# Patient Record
Sex: Female | Born: 1970 | Race: White | Hispanic: No | Marital: Single | Smoking: Current every day smoker
Health system: Southern US, Community
[De-identification: ages and names within clinical notes are randomized; demographics above are authoritative.]

## PROBLEM LIST (undated history)

## (undated) DIAGNOSIS — I10 Essential (primary) hypertension: Secondary | ICD-10-CM

## (undated) DIAGNOSIS — G459 Transient cerebral ischemic attack, unspecified: Secondary | ICD-10-CM

## (undated) DIAGNOSIS — E669 Obesity, unspecified: Secondary | ICD-10-CM

## (undated) DIAGNOSIS — E785 Hyperlipidemia, unspecified: Secondary | ICD-10-CM

## (undated) DIAGNOSIS — K859 Acute pancreatitis without necrosis or infection, unspecified: Secondary | ICD-10-CM

## (undated) DIAGNOSIS — F17200 Nicotine dependence, unspecified, uncomplicated: Secondary | ICD-10-CM

## (undated) HISTORY — DX: Acute pancreatitis without necrosis or infection, unspecified: K85.90

## (undated) HISTORY — DX: Transient cerebral ischemic attack, unspecified: G45.9

## (undated) HISTORY — PX: CHOLECYSTECTOMY: SHX55

---

## 2006-06-18 ENCOUNTER — Emergency Department: Payer: Self-pay | Admitting: Emergency Medicine

## 2008-03-12 ENCOUNTER — Inpatient Hospital Stay: Payer: Self-pay | Admitting: Obstetrics & Gynecology

## 2008-10-16 ENCOUNTER — Emergency Department: Payer: Self-pay | Admitting: Emergency Medicine

## 2009-08-10 ENCOUNTER — Ambulatory Visit: Payer: Self-pay | Admitting: Family Medicine

## 2009-08-10 ENCOUNTER — Encounter (INDEPENDENT_AMBULATORY_CARE_PROVIDER_SITE_OTHER): Payer: Self-pay | Admitting: Family Medicine

## 2009-08-10 ENCOUNTER — Encounter: Payer: Self-pay | Admitting: Family Medicine

## 2009-08-10 DIAGNOSIS — J018 Other acute sinusitis: Secondary | ICD-10-CM

## 2010-03-29 ENCOUNTER — Emergency Department: Payer: Self-pay | Admitting: Unknown Physician Specialty

## 2010-05-28 NOTE — Assessment & Plan Note (Signed)
Summary: SINUS-EAR/JBB

## 2010-05-28 NOTE — Assessment & Plan Note (Signed)
Summary: Sinusitis   Vital Signs:  Patient Profile:   40 Years Old Female CC:      POSSIBLE SINUS INFECTION / RWT Height:     60 inches Weight:      187 pounds BMI:     36.65 Temp:     98.6 degrees F oral Pulse rate:   86 / minute Pulse rhythm:   regular Resp:     20 per minute BP sitting:   120 / 76  (left arm)  Vitals Entered By: Levonne Spiller EMT-P (August 10, 2009 11:59 AM)             Is Patient Diabetic? No Comments PT. IS A SMOKER. 1 PACK PER DAY.      Current Allergies: ! CODEINE SULFATE (CODEINE SULFATE)History of Present Illness Chief Complaint: POSSIBLE SINUS INFECTION / RWT History of Present Illness: Has been fighting a sinus infection for about 3 weeks now. No f/c. No history of frequent infections. Feels like her "head is going to explode". Thought she was getting better but this week has been much worse and last 3 days she feels like "she has to do something". Her 31mo child has also been sick. He is in daycare. She has taken him to 2 hospitals in the last week.   REVIEW OF SYSTEMS Constitutional Symptoms       Complains of fatigue.     Denies fever, chills, and night sweats.  Ear/Nose/Throat/Mouth       Complains of ear pain, frequent runny nose, and sinus problems.      Denies dizziness, sore throat, and hoarseness.  Respiratory       Denies dry cough, productive cough, wheezing, and shortness of breath.      Physical Exam General appearance: WDWN female - tired appearing Ears: reduced light reflex bilaterally Nasal: swollen red turbinates with congestion, clear discharge Oral/Pharynx: tongue normal, posterior pharynx without erythema or exudate Neck: supple,anterior lymphadenopathy present, tender Chest/Lungs: no rales, wheezes, or rhonchi bilateral, breath sounds equal without effort Heart: regular rate and  rhythm, no murmur Tender red maxillary sinuses, right with swelling. Tender frontal sinuses. Assessment New Problems: OTHER ACUTE SINUSITIS  (ICD-461.8)   Patient Education: Patient and/or caregiver instructed in the following: rest fluids and Tylenol, quit smoking.  Plan New Medications/Changes: MAXIFED 60-400 MG TABS (PSEUDOEPHEDRINE-GUAIFENESIN) 1 by mouth Q8 hours as needed congestion  #20 x 0, 08/10/2009, Tacey Ruiz MD AMOXICILLIN 500 MG CAPS (AMOXICILLIN) 1 by mouth three times a day x 10 days  #30 x 0, 08/10/2009, Tacey Ruiz MD  New Orders: New Patient Level II [phone]  The patient and/or caregiver has been counseled thoroughly with regard to medications prescribed including dosage, schedule, interactions, rationale for use, and possible side effects and they verbalize understanding.  Diagnoses and expected course of recovery discussed and will return if not improved as expected or if the condition worsens. Patient and/or caregiver verbalized understanding.  Prescriptions: MAXIFED 60-400 MG TABS (PSEUDOEPHEDRINE-GUAIFENESIN) 1 by mouth Q8 hours as needed congestion  #20 x 0   Entered and Authorized by:   Tacey Ruiz MD   Signed by:   Tacey Ruiz MD on 08/10/2009   Method used:   Electronically to        [facility] [address] (retail)       [address], [address]       [place], [place]  [address]       Ph:  [phone]       Fax: [phone]   RxID:   [id number] AMOXICILLIN 500 MG CAPS (AMOXICILLIN) 1 by mouth three times a day x 10 days  #30 x 0   Entered and Authorized by:   Tacey Ruiz MD   Signed by:   Tacey Ruiz MD on 08/10/2009   Method used:   Electronically to        [facility] [address] (retail)       [address], [address]       [place], [place]  [address]       Ph: [phone]       Fax: [phone]   RxID:   [id number]   Patient Instructions: 1)  Please schedule a follow-up appointment as needed. 2)  Tobacco is very bad for your health and your loved ones! You Should stop smoking!. 3)  Take over the counter cough and cold  medications only as directed on the package insert. DO NOT take more than recommended . 4)  Take your antibiotic as prescribed until ALL of it is gone, but stop if you develop a rash or swelling and contact our office as soon as possible.  __________________________________________________________________________________ I have reviewed the above medical office visit documention, including diagnoses, history, medications, clinical lists, orders and plan of care.   Rodney Langton, MD, FAAFP  August 10, 2009 Added new allergy or adverse reaction of CODEINE SULFATE (CODEINE SULFATE) - Signed     Rodney Langton, M.D., F.A.A.F.P.  August 10, 2009

## 2010-10-25 ENCOUNTER — Emergency Department: Payer: Self-pay | Admitting: Emergency Medicine

## 2011-04-17 ENCOUNTER — Emergency Department: Payer: Self-pay | Admitting: Emergency Medicine

## 2011-04-27 ENCOUNTER — Emergency Department: Payer: Self-pay | Admitting: Emergency Medicine

## 2011-07-07 ENCOUNTER — Emergency Department: Payer: Self-pay | Admitting: Emergency Medicine

## 2011-07-11 ENCOUNTER — Inpatient Hospital Stay: Payer: Self-pay | Admitting: Internal Medicine

## 2011-07-11 LAB — BASIC METABOLIC PANEL
Anion Gap: 13 (ref 7–16)
Calcium, Total: 8.6 mg/dL (ref 8.5–10.1)
Co2: 23 mmol/L (ref 21–32)
EGFR (African American): >60
Osmolality: 279 (ref 275–301)
Sodium: 140 mmol/L (ref 136–145)

## 2011-07-11 LAB — TSH: Thyroid Stimulating Horm: 1.11 u[IU]/mL

## 2011-07-11 LAB — CBC
HCT: 40 % (ref 35.0–47.0)
MCHC: 33.1 g/dL (ref 32.0–36.0)

## 2011-07-12 LAB — CBC WITH DIFFERENTIAL/PLATELET
Eosinophil #: 0 10*3/uL (ref 0.0–0.7)
HCT: 33.9 % — ABNORMAL LOW (ref 35.0–47.0)
Lymphocyte %: 8.7 %
MCHC: 33.2 g/dL (ref 32.0–36.0)
MCV: 85 fL (ref 80–100)
Monocyte #: 0.6 10*3/uL (ref 0.0–0.7)
Neutrophil %: 86.7 %
RBC: 3.99 10*6/uL (ref 3.80–5.20)
RDW: 15 % — ABNORMAL HIGH (ref 11.5–14.5)
WBC: 14.7 10*3/uL — ABNORMAL HIGH (ref 3.6–11.0)

## 2011-07-12 LAB — BASIC METABOLIC PANEL
Chloride: 110 mmol/L — ABNORMAL HIGH (ref 98–107)
Co2: 22 mmol/L (ref 21–32)
EGFR (Non-African Amer.): >60
Osmolality: 285 (ref 275–301)

## 2011-07-13 LAB — CBC WITH DIFFERENTIAL/PLATELET
Basophil #: 0 10*3/uL (ref 0.0–0.1)
Basophil %: 0 %
HCT: 32.2 % — ABNORMAL LOW (ref 35.0–47.0)
Lymphocyte #: 1.6 10*3/uL (ref 1.0–3.6)
MCH: 28.1 pg (ref 26.0–34.0)
MCV: 86 fL (ref 80–100)
Monocyte #: 0.7 10*3/uL (ref 0.0–0.7)
Monocyte %: 4.3 %
Neutrophil #: 14.6 10*3/uL — ABNORMAL HIGH (ref 1.4–6.5)
Platelet: 250 10*3/uL (ref 150–440)
RDW: 15.4 % — ABNORMAL HIGH (ref 11.5–14.5)
WBC: 16.9 10*3/uL — ABNORMAL HIGH (ref 3.6–11.0)

## 2011-07-13 LAB — BASIC METABOLIC PANEL
Calcium, Total: 8.5 mg/dL (ref 8.5–10.1)
Chloride: 110 mmol/L — ABNORMAL HIGH (ref 98–107)
Co2: 24 mmol/L (ref 21–32)
EGFR (African American): >60
EGFR (Non-African Amer.): >60
Glucose: 132 mg/dL — ABNORMAL HIGH (ref 65–99)
Osmolality: 283 (ref 275–301)
Potassium: 3.8 mmol/L (ref 3.5–5.1)
Sodium: 142 mmol/L (ref 136–145)

## 2011-07-15 LAB — CBC WITH DIFFERENTIAL/PLATELET
Basophil %: 0.1 %
Eosinophil #: 0 10*3/uL (ref 0.0–0.7)
Eosinophil %: 0 %
HCT: 31.4 % — ABNORMAL LOW (ref 35.0–47.0)
HGB: 10.4 g/dL — ABNORMAL LOW (ref 12.0–16.0)
Lymphocyte %: 15.8 %
Monocyte %: 5.7 %
Neutrophil #: 9.5 10*3/uL — ABNORMAL HIGH (ref 1.4–6.5)
Neutrophil %: 78.4 %
RDW: 15 % — ABNORMAL HIGH (ref 11.5–14.5)
WBC: 12.1 10*3/uL — ABNORMAL HIGH (ref 3.6–11.0)

## 2011-07-15 LAB — BASIC METABOLIC PANEL
BUN: 15 mg/dL (ref 7–18)
Calcium, Total: 8.5 mg/dL (ref 8.5–10.1)
Chloride: 103 mmol/L (ref 98–107)
Creatinine: 0.69 mg/dL (ref 0.60–1.30)
EGFR (African American): >60

## 2011-07-16 LAB — CULTURE, BLOOD (SINGLE)

## 2011-09-23 ENCOUNTER — Ambulatory Visit: Payer: Self-pay | Admitting: Specialist

## 2011-12-02 ENCOUNTER — Inpatient Hospital Stay: Payer: Self-pay | Admitting: Internal Medicine

## 2011-12-02 LAB — URINALYSIS, COMPLETE
Glucose,UR: NEGATIVE mg/dL (ref 0–75)
Ketone: NEGATIVE
Leukocyte Esterase: NEGATIVE
Ph: 5 (ref 4.5–8.0)
RBC,UR: 1 /HPF (ref 0–5)
Squamous Epithelial: <1
WBC UR: <1 /HPF (ref 0–5)

## 2011-12-02 LAB — CBC WITH DIFFERENTIAL/PLATELET
Basophil %: 0.2 %
Eosinophil #: 0 10*3/uL (ref 0.0–0.7)
Eosinophil %: 0.2 %
HGB: 13 g/dL (ref 12.0–16.0)
Lymphocyte %: 33.5 %
Neutrophil %: 65.7 %
RBC: 4.6 10*6/uL (ref 3.80–5.20)

## 2011-12-02 LAB — COMPREHENSIVE METABOLIC PANEL
Albumin: 3.5 g/dL (ref 3.4–5.0)
Alkaline Phosphatase: 86 U/L (ref 50–136)
BUN: 6 mg/dL — ABNORMAL LOW (ref 7–18)
Calcium, Total: 8.6 mg/dL (ref 8.5–10.1)
Chloride: 106 mmol/L (ref 98–107)
Co2: 22 mmol/L (ref 21–32)
EGFR (Non-African Amer.): >60
Glucose: 99 mg/dL (ref 65–99)
Osmolality: 277 (ref 275–301)
Potassium: 3.5 mmol/L (ref 3.5–5.1)
SGOT(AST): 17 U/L (ref 15–37)
SGPT (ALT): 13 U/L (ref 12–78)
Total Protein: 7 g/dL (ref 6.4–8.2)

## 2011-12-02 LAB — CK TOTAL AND CKMB (NOT AT ARMC): CK-MB: <0.5 ng/mL — ABNORMAL LOW (ref 0.5–3.6)

## 2011-12-02 LAB — DRUG SCREEN, URINE
Barbiturates, Ur Screen: NEGATIVE (ref ?–200)
Cannabinoid 50 Ng, Ur ~~LOC~~: NEGATIVE (ref ?–50)
Cocaine Metabolite,Ur ~~LOC~~: NEGATIVE (ref ?–300)
MDMA (Ecstasy)Ur Screen: NEGATIVE (ref ?–500)
Methadone, Ur Screen: NEGATIVE (ref ?–300)
Opiate, Ur Screen: NEGATIVE (ref ?–300)
Phencyclidine (PCP) Ur S: NEGATIVE (ref ?–25)
Tricyclic, Ur Screen: NEGATIVE (ref ?–1000)

## 2011-12-03 LAB — BASIC METABOLIC PANEL
BUN: 11 mg/dL (ref 7–18)
Calcium, Total: 8.5 mg/dL (ref 8.5–10.1)
Chloride: 111 mmol/L — ABNORMAL HIGH (ref 98–107)
EGFR (African American): >60
EGFR (Non-African Amer.): >60
Glucose: 149 mg/dL — ABNORMAL HIGH (ref 65–99)
Osmolality: 283 (ref 275–301)
Sodium: 141 mmol/L (ref 136–145)

## 2011-12-03 LAB — CBC WITH DIFFERENTIAL/PLATELET
Basophil #: 0 10*3/uL (ref 0.0–0.1)
Basophil %: 0 %
Eosinophil #: 0 10*3/uL (ref 0.0–0.7)
HGB: 12 g/dL (ref 12.0–16.0)
Lymphocyte %: 3.4 %
MCH: 28.2 pg (ref 26.0–34.0)
MCHC: 34 g/dL (ref 32.0–36.0)
MCV: 83 fL (ref 80–100)
Monocyte %: 2.4 %
Neutrophil %: 94.2 %
RBC: 4.25 10*6/uL (ref 3.80–5.20)
RDW: 15 % — ABNORMAL HIGH (ref 11.5–14.5)
WBC: 12.6 10*3/uL — ABNORMAL HIGH (ref 3.6–11.0)

## 2012-10-31 ENCOUNTER — Emergency Department: Payer: Self-pay | Admitting: Emergency Medicine

## 2012-10-31 LAB — LIPASE, BLOOD: Lipase: 390 U/L (ref 73–393)

## 2012-10-31 LAB — COMPREHENSIVE METABOLIC PANEL
Albumin: 3.8 g/dL (ref 3.4–5.0)
Bilirubin,Total: 0.4 mg/dL (ref 0.2–1.0)
Chloride: 106 mmol/L (ref 98–107)
EGFR (African American): >60
Osmolality: 277 (ref 275–301)
Potassium: 3.9 mmol/L (ref 3.5–5.1)
SGPT (ALT): 16 U/L (ref 12–78)
Sodium: 138 mmol/L (ref 136–145)
Total Protein: 7.2 g/dL (ref 6.4–8.2)

## 2012-10-31 LAB — URINALYSIS, COMPLETE
Bacteria: NONE SEEN
Bilirubin,UR: NEGATIVE
Glucose,UR: NEGATIVE mg/dL (ref 0–75)
Ketone: NEGATIVE
Leukocyte Esterase: NEGATIVE
Nitrite: NEGATIVE
Ph: 6 (ref 4.5–8.0)
Protein: NEGATIVE
RBC,UR: 1 /HPF (ref 0–5)
Specific Gravity: 1.011 (ref 1.003–1.030)
Squamous Epithelial: 1
WBC UR: <1 /HPF (ref 0–5)

## 2012-10-31 LAB — CBC
HCT: 36.4 % (ref 35.0–47.0)
HGB: 12.4 g/dL (ref 12.0–16.0)
MCH: 28.8 pg (ref 26.0–34.0)
MCHC: 34.2 g/dL (ref 32.0–36.0)
MCV: 84 fL (ref 80–100)
Platelet: 253 10*3/uL (ref 150–440)
RBC: 4.33 10*6/uL (ref 3.80–5.20)
RDW: 14.6 % — ABNORMAL HIGH (ref 11.5–14.5)
WBC: 10 10*3/uL (ref 3.6–11.0)

## 2012-10-31 LAB — TROPONIN I: Troponin-I: <0.02 ng/mL

## 2012-11-01 ENCOUNTER — Inpatient Hospital Stay: Payer: Self-pay | Admitting: Student

## 2012-11-01 LAB — COMPREHENSIVE METABOLIC PANEL
Albumin: 3.7 g/dL (ref 3.4–5.0)
Bilirubin,Total: 0.3 mg/dL (ref 0.2–1.0)
Calcium, Total: 9 mg/dL (ref 8.5–10.1)
Chloride: 108 mmol/L — ABNORMAL HIGH (ref 98–107)
EGFR (Non-African Amer.): >60
Osmolality: 275 (ref 275–301)
SGOT(AST): 13 U/L — ABNORMAL LOW (ref 15–37)
Sodium: 139 mmol/L (ref 136–145)
Total Protein: 7 g/dL (ref 6.4–8.2)

## 2012-11-01 LAB — CBC WITH DIFFERENTIAL/PLATELET
Basophil #: 0.1 10*3/uL (ref 0.0–0.1)
Basophil %: 0.7 %
HCT: 36.4 % (ref 35.0–47.0)
HGB: 12.2 g/dL (ref 12.0–16.0)
Lymphocyte %: 25.4 %
MCH: 28.5 pg (ref 26.0–34.0)
MCHC: 33.5 g/dL (ref 32.0–36.0)
MCV: 85 fL (ref 80–100)
Monocyte #: 0.6 x10 3/mm (ref 0.2–0.9)
Monocyte %: 4.9 %
Neutrophil %: 67.6 %
RDW: 14.4 % (ref 11.5–14.5)

## 2012-11-02 LAB — COMPREHENSIVE METABOLIC PANEL
Albumin: 3.4 g/dL (ref 3.4–5.0)
Alkaline Phosphatase: 77 U/L (ref 50–136)
Anion Gap: 6 — ABNORMAL LOW (ref 7–16)
Bilirubin,Total: 0.4 mg/dL (ref 0.2–1.0)
Calcium, Total: 8.5 mg/dL (ref 8.5–10.1)
Co2: 26 mmol/L (ref 21–32)
Osmolality: 281 (ref 275–301)
SGOT(AST): 12 U/L — ABNORMAL LOW (ref 15–37)
SGPT (ALT): 11 U/L — ABNORMAL LOW (ref 12–78)
Sodium: 142 mmol/L (ref 136–145)
Total Protein: 6.2 g/dL — ABNORMAL LOW (ref 6.4–8.2)

## 2012-11-02 LAB — CBC WITH DIFFERENTIAL/PLATELET
Basophil #: 0 10*3/uL (ref 0.0–0.1)
Eosinophil #: 0.2 10*3/uL (ref 0.0–0.7)
HCT: 33.9 % — ABNORMAL LOW (ref 35.0–47.0)
HGB: 11.4 g/dL — ABNORMAL LOW (ref 12.0–16.0)
Lymphocyte #: 3.8 10*3/uL — ABNORMAL HIGH (ref 1.0–3.6)
Lymphocyte %: 39.8 %
MCHC: 33.7 g/dL (ref 32.0–36.0)
MCV: 85 fL (ref 80–100)
Monocyte %: 6.4 %
Neutrophil #: 4.8 10*3/uL (ref 1.4–6.5)
Neutrophil %: 51.4 %
RDW: 14.7 % — ABNORMAL HIGH (ref 11.5–14.5)

## 2012-11-22 ENCOUNTER — Ambulatory Visit: Payer: Self-pay | Admitting: Surgery

## 2012-11-22 LAB — HEPATIC FUNCTION PANEL A (ARMC)
Bilirubin, Direct: <0.1 mg/dL (ref 0.00–0.20)
SGOT(AST): 12 U/L — ABNORMAL LOW (ref 15–37)

## 2012-11-23 LAB — PATHOLOGY REPORT

## 2013-04-15 ENCOUNTER — Emergency Department: Payer: Self-pay | Admitting: Emergency Medicine

## 2014-06-28 ENCOUNTER — Emergency Department: Payer: Self-pay | Admitting: Emergency Medicine

## 2014-07-01 ENCOUNTER — Inpatient Hospital Stay: Payer: Self-pay | Admitting: Internal Medicine

## 2014-08-15 NOTE — Discharge Summary (Signed)
PATIENT NAME:  Jenna Flowers, Jenna Flowers MR#:  [id number] DATE OF BIRTH:  1971-01-15  DATE OF ADMISSION:  12/02/2011 DATE OF DISCHARGE:  12/03/2011  ADMITTING PHYSICIAN:  Dr. Luberta MutterKonidena DISCHARGING PHYSICIAN:  Dr. Nemiah CommanderKalisetti   PRIMARY CARE PHYSICIAN: [facility]   CONSULTATIONS IN THE HOSPITAL: None.   DISCHARGE DIAGNOSES:  1. Allergic reaction to amoxicillin with rash and nausea, vomiting, diarrhea, questionable angioedema.  2. Viral gastroenteritis/food poisoning.  3. Acute sinusitis and bronchitis.   DISCHARGE HOME MEDICATIONS:  1. Benadryl 25 mg p.o. every six hours p.r.n. for itching or allergic reaction.  2. Levaquin 500 mg p.o. daily for four more days.  3. Prednisone 50 mg p.o. daily and taper off x 10 mg every day.   DISCHARGE DIET:  Regular diet.  DISCHARGE ACTIVITY: As tolerated.     FOLLOWUP INSTRUCTIONS:  Primary care physician followup in 1 to 2 weeks.   LABS AND IMAGING STUDIES: WBC 12.6, hemoglobin 12.3, hematocrit 35.2, platelet count 219. Sodium 141, potassium 3.8, chloride 111, bicarbonate 23, BUN 11, creatinine 0.9, glucose 149, calcium 8.5. Urinalysis negative for any infection. Urine tox screen is negative. Chest x-ray showing clear lung fields. No acute cardiopulmonary disease.   BRIEF HOSPITAL COURSE: Jenna Flowers is a 44 year old female with no significant past medical history other than hospitalization in March this year for bilateral pneumonia who presents to the hospital after she had a bout of difficulty breathing, nausea, vomiting, and diarrhea after taking one dose of amoxicillin that she received at the [facility] for sinus symptoms with sore throat and ear pain.  1. Allergic reaction:  With shortness of breath, minimal tongue swelling. She was hypoxic on presentation. Not sure if it is definitely from amoxicillin as she has received amoxicillin in the past without any problems. Anyway, amoxicillin was stopped, added to her allergy list, and she was given  IV Solu-Medrol, Benadryl and has recovered completely at this time.  2. Nausea, vomiting, diarrhea: Could be from amoxicillin as a side effect.  Could be from food poisoning as she had [facility] [employer] [facility]. Also, she had one episode of fever here, which could represent a viral gastroenteritis. Either way she got IV fluids and supportive treatment and she has been afebrile for more than 12 hours at this time.  Nausea,  vomiting, and diarrhea have completely resolved and she is able to eat regular food.  3. Acute sinusitis, bronchitis with history of prior pneumonia:  Her cough sounds wheezy though lungs are clear on examination. She is being sent home on a prednisone taper and also Levaquin, which she has received in the hospital.  Her course has been otherwise uneventful in the hospital.   DISCHARGE CONDITION: Stable.   DISCHARGE DISPOSITION: Home.   TIME SPENT ON DISCHARGE: 45 minutes.     ____________________________ Enid Baasadhika Matea Stanard, MD rk:bjt Flowers: 12/03/2011 13:38:52 ET T: 12/04/2011 10:04:14 ET JOB#: [id number]  cc: Enid Baasadhika Gali Spinney, MD, <Dictator> [facility] Enid BaasADHIKA Montez Cuda MD ELECTRONICALLY SIGNED 12/10/2011 14:18

## 2014-08-15 NOTE — H&P (Signed)
PATIENT NAME:  Jenna Flowers, Jenna Flowers MR#:  [id number] DATE OF BIRTH:  04-Jul-1970  DATE OF ADMISSION:  12/02/2011  PRIMARY DOCTOR: None local.  ER PHYSICIAN: Dr. Daryel November   CHIEF COMPLAINT: Shortness of breath.   HISTORY OF PRESENT ILLNESS: The patient is a 44 year old female with recent hospitalization in March for bilateral pneumonia and COPD who came in because of shortness of breath, diarrhea, and vomiting. The patient actually went to [facility] because she was having sinus symptoms with sore throat and ear pain and she was given amoxicillin. After she took the amoxicillin around noon she started to have tongue swelling sensation and right facial tingling and numbness. She thought this was probably an allergic reaction. The patient after having amoxicillin she ate [employer] french fries and double cheeseburger and all the symptoms started after she ate. The patient was feeling like her tongue was swollen and throat was closing and also noticed some rash on the right side of her face as well as some tingling and numbness on the right side of the face. She also noticed profuse diarrhea and vomiting happening after that. She was thinking she got some allergic reaction to amoxicillin. Oxygen sat 96% on 3 liters. Her ABG showed pH of 7.43, pCO2 30, pO2 64. Lactic acid 3.2. We were asked to admit for hypoxia and lactic acidosis. The patient now feels better and her tongue feels okay and feels no shortness of breath. Sats are 96/97% on 2 liters.   PAST MEDICAL HISTORY: Hospitalization in March. She was admitted from March 15th to 22nd for bilateral pneumonia at that time and she was discharged with Levaquin and prednisone. The patient did have a follow-up with Dr. Meredeth Ide, had a repeat CT of the chest and she had improvement in chest CAT scan and she said that everything was okay.   ALLERGIES: She is allergic to codeine.   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 Still smokes  1 pack per day. No alcohol. The patient's son had some streptococcal infection recently.   PAST SURGICAL HISTORY: Wart on the feet surgery.   FAMILY HISTORY: Hypertension in mother and grandmother. CVA and diabetes in father and grandmother on father's side   MEDICATIONS: She takes only over-the-counter cold medications.   REVIEW OF SYSTEMS: CONSTITUTIONAL: Feels stuffy, sinus pain. Complains of fatigue. EYES: No blurred vision. ENT: No tinnitus. No epistaxis. No difficulty swallowing. RESPIRATORY: Mild cough present. No shortness of breath at this time. CARDIOVASCULAR: No chest pain. No palpitations. GI: Nausea, vomiting, diarrhea, mild abdominal pain that happened after she ate lunch. GU: No dysuria. ENDOCRINE: No polyuria or nocturia. INTEGUMENTARY: The patient has some rash noted from the right cheek but no rash anywhere else. MUSCULOSKELETAL: No joint pains. NEUROLOGIC: The patient has no focal neurological deficit. No numbness or tremors. PSYCHIATRIC: No anxiety or insomnia.   PHYSICAL EXAMINATION:   VITAL SIGNS: Temperature 98.5, pulse 109, respirations 30, blood pressure 123/64, oxygen sats initially 82% on room air. Right now she is on 2 liters saturating at 96%.  GENERAL: Alert, awake, oriented answering questions appropriately.   HEENT: Head atraumatic, normocephalic. Pupils equally reacting to light. Extraocular movements are intact.   ENT: No tympanic membrane congestion. No turbinate hypertrophy. No oropharyngeal erythema.   NECK: Normal range of motion. No JVD. No carotid bruit.    CARDIOVASCULAR: S1, S2 regular, tachycardic.   LUNGS: Lungs are clear. No wheezing observed. The patient is not using accessory muscles for respiration.  ABDOMEN: Soft, nontender, nondistended. Bowel sounds present. The patient has no epigastric tenderness.   SKIN: I do not see any skin rashes. On the right cheek she is slightly red and some rash observed.   NEUROLOGIC: Cranial nerves II  through XII intact. Power 5/5 in upper and lower extremities. Sensation intact. Deep tendon reflexes 2+ bilaterally.   LABORATORY DATA: WBC 3.5, hemoglobin 13, hematocrit 38.8, platelets 246. Electrolytes sodium 140, potassium 3.5, chloride 106, bicarb 22, BUN 6, creatinine 0.83, glucose 99. Liver functions within normal limits. Troponin less than 0.02. BNP slightly elevated at 157.   Chest x-ray showed no active cardiopulmonary abnormality.   Urinalysis is clear. No leukocyte esterase. ABG pH 7.43, pCO2 30, pO2 64. Oxygen sat 96%. Lactic acid is 3.2.   EKG showed sinus tach at 120 beats per minute.   ASSESSMENT AND PLAN:  1. The patient is 44 year old female with shortness of breath and anaphylaxis symptoms that started after taking amoxicillin. She may have allergic reaction to amoxicillin. The patient does not have full blown anaphylaxis but felt like her throat was closing. Her symptoms improved after she was given Solu-Medrol 125 mg and also epinephrine and Benadryl in the Emergency Room. The patient will have allergy to amoxicillin listed in her allergies. Has no rash, no tongue swelling, and her hypoxia improved after Solu-Medrol. The patient's sats are 96% on room air. We're going to keep her overnight and monitor her for symptoms. Continue the Solu-Medrol along with Benadryl. Continue to wean off oxygen as tolerated. She received amoxicillin previously in March but right now symptoms are more suggestive of anaphylaxis to amoxicillin so we will add allergy to amoxicillin to her allergy list.  2. She has sinusitis symptoms and strep throat will be ordered for her because of sick contact with her son. Continue the Levaquin.  3. Nausea, vomiting, and diarrhea likely related to food poisoning. She ate at [employer] and her symptoms started immediately. We are going to give Zofran for nausea. Give IV fluids. Check stool for Clostridium difficile and Norovirus. Will give her only clear liquids at this  time and continue PPIs.  4. Lactic acidosis likely related to her allergy to amoxicillin and also combination of nausea, vomiting, and diarrhea which can probably contribute to the lactic acidosis.       TIME SPENT ON HISTORY AND PHYSICAL: About 60 minutes.   ____________________________ Katha HammingSnehalatha Imanii Gosdin, MD sk:drc D: 12/02/2011 18:23:52 ET T: 12/03/2011 06:19:20 ET JOB#: [id number]  cc: Katha HammingSnehalatha Bladen Umar, MD, <Dictator> Katha HammingSNEHALATHA Vickii Volland MD ELECTRONICALLY SIGNED 12/12/2011 13:17

## 2014-08-18 NOTE — Consult Note (Signed)
PATIENT NAME:  Jenna Flowers, Jenna Flowers MR#:  [id number] DATE OF BIRTH:  02-26-1971  DATE OF CONSULTATION:  11/01/2012  REFERRING PHYSICIAN:   CONSULTING PHYSICIAN:  Scot Junobert T. Elliott, MD  HISTORY OF PRESENT ILLNESS:  The patient is a 44 year old white female who had onset of abdominal pain, epigastric area, when she awoke Saturday, 2 days ago. Went to the ER and ultrasound was positive for gallstones. She felt better after getting some medication in the ER and she followed up in the office today, where she was found to be in significant pain. Decision was made to admit her to the hospital. I was asked to see her in consultation.   The patient denies any abdominal surgery in the past. She has had 2 natural childbirths without incident and without hospitalization.   FAMILY HISTORY: Positive for gallbladder disease in sister and a niece. Both of her parents are alive and in good health.   ALLERGIES: AMOXICILLIN AND CODEINE.   CURRENT MEDICATIONS: Aleve p.r.n.   HABITS: Smokes a pack a day. Rare alcohol use. Works as a Building services engineerflorist.   REVIEW OF SYSTEMS: She complains of fatigue and weakness, abdominal pain. No vomiting. No chest pains. No dysuria or hematuria. She was hospitalized for pneumonia last year.   PHYSICAL EXAMINATION GENERAL: White female, looks to be in some discomfort.  HEENT: Sclerae anicteric. Conjunctivae negative.  HEAD: Atraumatic.  CHEST: Clear.  HEART: No murmurs, gallops, clicks or rubs.  ABDOMEN: There is tenderness in the epigastric and right upper quadrant to finger percussion, worse so in the right upper quadrant. Bowel sounds are present.  VITAL SIGNS: Temperature 98.9, pulse 64, blood pressure 146/84.   IMAGING:  Ultrasound shows gallstones and also a 2 cm calcification in the head of the pancreas.   ASSESSMENT: Right upper quadrant pain, epigastric pain, gallstones present on ultrasound. Tenderness in the right upper quadrant on gentle finger percussion, likely has an  acute cholecystitis with gallstones. The calcification in the pancreas needs to be further clarified. Will get MRCP and then probably tomorrow MRI or CAT scan. In the meantime, get a surgical consult with hospitalist surgeon, Dr. Egbert GaribaldiBird.   The patient lists codeine as an allergy, says it made her have trouble breathing. Will start with 1 mg of morphine and see how she does, probably put her on a PCA pump for pain control. If she has trouble with 1 mg morphine test dose, we may have to not use that. She has been ordered Toradol for now. This can be ulcerogenic.   ____________________________ Scot Junobert T. Elliott, MD rte:cs Flowers: 11/01/2012 18:44:18 ET T: 11/01/2012 18:59:08 ET JOB#: [id number]  cc: Scot Junobert T. Elliott, MD, <Dictator> [facility] A. Egbert GaribaldiBird, MD Srikar R. Elpidio AnisSudini, MD  Scot JunOBERT T ELLIOTT MD ELECTRONICALLY SIGNED 11/20/2012 8:06

## 2014-08-18 NOTE — H&P (Signed)
PATIENT NAME:  Jenna Flowers, EISCHEID MR#:  [id number] DATE OF BIRTH:  10-07-1970  DATE OF ADMISSION:  11/01/2012  PRIMARY CARE PHYSICIAN: None.   CHIEF COMPLAINT: Abdominal pain.   HISTORY OF PRESENT ILLNESS: The patient is a 44 year old female patient with no past medical history except tobacco abuse, presents to the hospital complaining of abdominal pain from the GI clinic. The patient was seen in the Emergency Room yesterday was found to have cholelithiasis without acute cholecystitis on ultrasound. She also had a large round hyperechoic focus with shadowing in the region of the pancreatic head with possible large calcification. Also, found to have large stone in the pancreatic duct, was discharged home to follow up with GI clinic.  Today in the GI clinic, the patient saw the nurse practitioner with intractable pain. The patient is being admitted to the hospitalist service. The patient's lipase and liver function tests were normal yesterday. She has been afebrile. Nausea. No appetite, has not eaten anything since yesterday. No associated diarrhea. Abdominal pain is mostly in the left upper quadrant epigastric area nonradiating. No aggravating or relieving factors.   PAST MEDICAL HISTORY: None.   FAMILY HISTORY: Diabetes in her mother.   ALLERGIES: AMOXICILLIN AND CODEINE.   SOCIAL HISTORY: The patient smokes a pack a day. Rare alcohol use. Works as a Building services engineer.   HOME MEDICATIONS: Aleve p.r.n.   REVIEW OF SYSTEMS:   CONSTITUTIONAL: Fatigue, weakness. No weight loss, weight gain.   EYES: No blurred vision, pain, redness.   ENT: No tinnitus, ear pain, hearing loss.   RESPIRATORY: No cough or shortness of breath.   CARDIOVASCULAR: No chest pain, orthopnea, edema.   GASTROINTESTINAL: Has nausea. No vomiting. Has abdominal pain. No hematemesis, melena.   GENITOURINARY: No dysuria, hematuria or frequency.   INTEGUMENTARY:  No acne, rash, lesions.   MUSCULOSKELETAL: No joint swelling,  redness, effusion of the large joints. No back pain.   NEUROLOGIC: No focal numbness, weakness, dysarthria.   PSYCHIATRIC: No anxiety or depression.   HEMATOLOGIC AND LYMPHATIC: No anemia, easy bruising, bleeding.   ENDOCRINE: No polyuria, nocturia, thyroid problems   PHYSICAL EXAMINATION: VITAL SIGNS: Blood pressure 128/70, pulse of 92.   GENERAL: Obese Caucasian female patient lying in bed in distress secondary to abdominal pain.   PSYCHIATRIC: Alert, oriented x 3. Mood and affect appropriate. Judgment intact.   HEENT: Atraumatic, normocephalic. Oral mucosa dry and pink. No oral ulcers or thrush. External ears and nose normal. No pallor to light. He got to light.   NECK: Supple. No thyromegaly. No palpable lymph nodes. Trachea midline. No carotid bruit, JVD.   CARDIOVASCULAR: S1, S2, without any murmurs. Peripheral pulses 2+.   RESPIRATORY: Normal work of breathing. Clear to auscultation on both sides.   GASTROINTESTINAL: Soft abdomen, nontender with tenderness in the epigastric and left upper quadrant areas.  Murphy's sign negative. Bowel sounds present. No hepatosplenomegaly palpable.   SKIN: Warm and dry. No petechiae, rash, ulcers.   MUSCULOSKELETAL: No joint swelling, redness, effusion of the large joints. Normal muscle tone.   NEUROLOGICAL: Motor strength 5/5 in upper and lower extremities. Sensation is intact all over. Cranial nerves II through XII intact.   LYMPHATIC: No cervical, supraclavicular lymphadenopathy.   GENITOURINARY: No CVA tenderness or bladder distention.   LABORATORY AND RADIOLOGICAL DATA: Lab studies from yesterday show BUN 7, creatinine 0.83. AST, ALT, alkaline phosphatase normal with lipase of 390. Urinalysis showed no bacteria.   Ultrasound of the abdomen showed cholelithiasis without acute cholecystitis and possible  pancreatic duct stone.   ASSESSMENT AND PLAN: 1. 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 The patient admitted directly  from the GI clinic. I discussed with GI and practitioner. We will admit the patient for pain management along with IV fluids. Check complete metabolic panel, lipase and CBC. Could be infection, possible cholangitis or pancreatitis. Check lipase. Get an M.R.C.P. Further input from gastrointestinal will be awaited. Will start her on Toradol and Ultram for pain management. The patient will be nothing by mouth except medications.  2. Tobacco abuse. Counseled the patient to quit smoking for greater than 3 minutes.  3. Deep venous thrombosis prophylaxis with heparin.   CODE STATUS: Full code.   TIME SPENT TODAY ON THIS CASE: Was 58 minutes.    ____________________________ Molinda BailiffSrikar R. Travoris Bushey, MD srs:rw D: 11/01/2012 16:15:51 ET T: 11/01/2012 16:40:59 ET JOB#: [id number]  cc: Wardell HeathSrikar R. Elpidio AnisSudini, MD, <Dictator> Scot Junobert T. Elliott, MD Orie FishermanSRIKAR R Evelean Bigler MD ELECTRONICALLY SIGNED 11/14/2012 23:35

## 2014-08-18 NOTE — Op Note (Signed)
PATIENT NAME:  Jenna Flowers, Jenna Flowers MR#:  [id number] DATE OF BIRTH:  16-Feb-1971  DATE OF PROCEDURE:  11/22/2012  PREOPERATIVE DIAGNOSIS: Symptomatic cholelithiasis.   POSTOPERATIVE DIAGNOSIS: Symptomatic cholelithiasis.   PROCEDURE PERFORMED: Laparoscopic cholecystectomy.   SURGEON: Natale LayMark Smt Lokey, MD   ASSISTANT: None.   ANESTHESIA: General endotracheal.   FINDINGS: Stones.   SPECIMENS: Gallbladder with stones.   ESTIMATED BLOOD LOSS: 25 mL.   DRAINS: None.   LAP AND NEEDLE COUNT:  Correct x 2.   DESCRIPTION OF PROCEDURE: With informed consent, supine position, general oral endotracheal anesthesia, the left arm was padded and tucked at her side. Her abdomen was widely prepped and draped with ChloraPrep solution. Timeout was observed. A 12 mm blunt Hassan trocar was placed through an open technique through an infraumbilical transverse oriented skin incision with stay sutures being passed through the fascia. Pneumoperitoneum was established. The stomach was distended with air and an orogastric tube was passed by anesthesia with success. Remaining trocars were placed with 5 mm trocars in the epigastric and 2 5 mm trocars in the right subcostal margin laterally. The gallbladder was grasped along its fundus and elevated towards the right shoulder. Lateral traction was placed on Hartmann's pouch. The hepatoduodenal ligament was then incised and dissection carried out such that a critical view of safety cholecystectomy was achieved. A single cystic artery was encountered. Cystic artery was doubly clipped on the portal side, singly clipped on the gallbladder side and divided. The cystic duct was triply clipped on the portal side, singly clipped on the gallbladder side and divided. Further dissection within this area demonstrated no evidence of accessory artery or bile duct. The specimen was then retrieved off the gallbladder fossa utilizing hook cautery apparatus, placed into an Endo Catch device and  retrieved. During the retrieval process, a 5 mm camera was placed in the epigastric port demonstrating no evidence of bowel injury at the umbilical port site insertion site.   Pneumoperitoneum was then re-established. A total of 1 liter of warm normal saline was utilized to irrigate the right upper quadrant. All fluid was aspirated with hemostasis being achieved on the operative field. Ports were then removed under direct visualization. A total of 30 mL of 0.25% Sensorcaine was infiltrated along all skin and fascial incisions prior to closure. An additional figure-of-eight #0 Vicryl suture in vertical orientation was used to achieve fascial closure at the umbilicus. The existing stay sutures were tied to each other. 4-0 Vicryl subcuticular was applied to all skin edges followed by benzoin, Steri-Strips, Telfa and Tegaderm. The patient was then subsequently extubated and taken to the recovery room in stable and satisfactory condition by anesthesia services.   ____________________________ Redge GainerMark A. Egbert GaribaldiBird, MD mab:sb Flowers: 11/22/2012 09:57:01 ET T: 11/22/2012 10:10:35 ET JOB#: [id number]  cc: Loraine LericheMark A. Egbert GaribaldiBird, MD, <Dictator> Raynald KempMARK A Dyamond Tolosa MD ELECTRONICALLY SIGNED 11/24/2012 15:56

## 2014-08-18 NOTE — Consult Note (Signed)
Pt given test dose of morphine 1mg  iv and appears to be tolerating it ok. Will increase dose to 2-4 mg q2 hours prn.  MRCP tonight or in morning.  If this is not clear about what is going on will consider CT and MRI of abd.  Surgical consult pending.  Electronic Signatures: Scot JunElliott, Taquita Demby T (MD)  (Signed on 07-Jul-14 18:48)  Authored  Last Updated: 07-Jul-14 18:48 by Scot JunElliott, Jearldean Gutt T (MD)

## 2014-08-18 NOTE — Discharge Summary (Signed)
PATIENT NAME:  Jenna Flowers, Bena D MR#:  [id number] DATE OF BIRTH:  1970/11/27  DATE OF ADMISSION:  11/01/2012 DATE OF DISCHARGE:  11/03/2012  CONSULTANTS:  Dr. Mechele CollinElliott from GI, Dr. Anda KraftMarterre and Dr. Egbert GaribaldiBird from surgery.   PRIMARY CARE PHYSICIAN:  None.   CHIEF COMPLAINT:  Abdominal pain.   DISCHARGE DIAGNOSES: 1.  Abdominal pain, likely from gallstones.  2.  Large calcification at the head of the pancreas most likely in the pancreatic duct with some ductal dilation and irregularity suggesting component of chronic pancreatitis with a possible stone in the pancreas as well.   DISCHARGE MEDICATIONS:  Tramadol 50 mg every 6 hours as needed for pain.   DIET:  Low sodium, GI soft diet.   ACTIVITY:  As tolerated.   FOLLOWUP:  Please follow with Dr. Egbert GaribaldiBird within a week.  Please follow Dr. Mechele CollinElliott from GI within 1 to 2 weeks.   DISPOSITION:  Home.  SIGNIFICANT LABORATORIES AND IMAGING:  MRCP showed gallstones as well as large calcification at the head of the pancreas, most likely in the pancreatic duct, a possible component of chronic pancreatitis and some pancreatic ductal dilation.  Chest, PA and lateral x-ray on arrival:  No acute cardiopulmonary disease.  Ultrasound of the abdomen on July 6 showed cholelithiasis without sonographic evidence of acute cholecystitis, large round hyperechoic focus with shadowing in the region of the pancreatic head likely correlates with large calcification seen in the region of the pancreatic head on prior chest CT could be nonspecific, but could be a stone in the pancreatic duct.  On July 7, creatinine was 0.73, sodium 139, potassium 3.7 AST 13, ALT 17, WBC 12.5, hemoglobin 12.2, platelets of 236.  HISTORY OF PRESENT ILLNESS AND HOSPITAL COURSE:  For full details of H and P, please see the dictation on July 7 by Dr. Elpidio AnisSudini, but briefly, this is a 44 year old female with no significant past medical history, a smoker who comes in from GI Clinic for pain. She, of  note, had been seen in the ER the day before and found to have cholelithiasis without an acute cholecystitis on ultrasound as was described above. She was also found to have a large stone in the pancreatic duct and discharged home with followup at the GI and was seen at GI Clinic on the 7th and referred here after the NP found her with intractable pain. She was admitted to the hospitalist service for pain control and further evaluation. She was seen by Dr. Mechele CollinElliott and Dr. Egbert GaribaldiBird and Anda KraftMarterre from surgery and GI. Her diet was slowly advanced and the patient underwent an MRCP showing the above findings. Per Dr. Mechele CollinElliott, the calcification of the pancreas is rather large and not amenable to an ERCP. The case was also discussed with surgery, Dr. Anda KraftMarterre, who believes that as the patient's pain has resolved and the patient has no nausea, vomiting and is tolerating p.o., she will undergo an outpatient laparoscopic cholecystectomy and follow up with Dr. Egbert GaribaldiBird, one of Dr. Hoy FinlayMarterre's partners, within a week for scheduling purposes and in regards to the pancreatic calcification and possible stone, as that is asymptomatic at this point. The lipase is within normal limits. She has had no significant weight loss or pain issues. That also would be taken care of as an outpatient and no surgical issue is warranted.  If that is a stone, that will require an extensive surgery with potential complications and as the patient is asymptomatic, that was not recommended at this point. She will  be discharged with followup with GI and surgery and Ultram for pain control, which at this point is significantly improved.  TOTAL TIME SPENT:  33 minutes   THE PATIENT IS FULL CODE.   ____________________________ Krystal Eaton, MD sa:ce D: 11/03/2012 15:44:30 ET T: 11/03/2012 17:57:20 ET JOB#: [id number]  cc: Krystal Eaton, MD, <Dictator> Krystal Eaton MD ELECTRONICALLY SIGNED 11/22/2012 14:33

## 2014-08-18 NOTE — Consult Note (Signed)
PATIENT NAME:  [name], Vastie D MR#:  [id number] DATE OF BIRTH:  11-30-1970  DATE OF CONSULTATION:  11/01/2012  REFERRING PHYSICIAN:   CONSULTING PHYSICIAN:  Sheryl Towell A. Egbert GaribaldiBird, MD  REASON FOR CONSULTATION: Abdominal pain and gallstones.   HISTORY: A 44 year old otherwise healthy white female was admitted to the medical service earlier today with a several day history of epigastric and right upper quadrant abdominal pain. She was seen by a local ER, at which point an ultrasound demonstrated gallstones. She was found also to have a calcification seen within the head of the pancreas. The patient was seen in follow up in GI medicine, at which point the patient was having intractable pain. Lipase and liver function tests were normal. She has had some nausea but no fever. No sick contacts. No diarrhea. The pain she describes as constant, unrelenting and radiating into her right back underneath her scapula. She denies any history of drug use or alcohol abuse. She has a family history of gallstones.   ALLERGIES: AMOXICILLIN AND CODEINE.   PAST MEDICAL HISTORY: None.   FAMILY HISTORY: Significant for diabetes and cholecystolithiasis and cholecystectomy.   SOCIAL HISTORY: The patient works as a Building services engineerflorist. Smokes a pack of cigarettes a day. Again, rare alcohol use.   HOME MEDICATIONS: Over-the-counter Aleve as needed.   REVIEW OF SYSTEMS: As per 10-point review, otherwise unremarkable except as noted per the history of present illness.   PHYSICAL EXAMINATION:  GENERAL: The patient is alert and oriented. Her sclerae are anicteric.  LUNGS: Clear.  HEART: Regular rate and rhythm.  ABDOMEN: Mildly distended, tender in the right upper quadrant to deep palpation.  VITAL SIGNS: 98.4, pulse of 75, respiratory rate of 19, blood pressure is 114/77.  EXTREMITIES: Warm and well perfused.  NECK: Supple. No adenopathy.  NEUROLOGIC: Grossly unremarkable. Cranial nerves are intact.  GENITOURINARY AND RECTAL:  Deferred.  EXTREMITIES: Normal.  SKIN: Warm and well perfused.   LABORATORY VALUES AND RADIOLOGY: Urine pregnancy test is negative. Urinalysis is negative. Electrolytes are unremarkable. Liver function tests are normal. Bilirubin 0.3. White count is 12.5, hemoglobin 12.2, hematocrit 36.4, platelet count 236,000. Review of ultrasound dated 10/31/2012 demonstrates a 2 cm calcification seen within the pancreatic head, pancreatic duct measuring 7 mm in size, multiple gallstones seen within the gallbladder. No gallbladder wall thickening or pericholecystic fluid. Review of CT scans of the chest done for followup of hilar and pericarinal lymphadenopathy dated 2003 demonstrate area of calcification seen within the head of the pancreas.   IMPRESSION: This is a 44 year old white female with clinical calculus cholecystitis. The calcifications seen within the head of her pancreas at this point are clearly in the duct. There is no history of pancreatitis in the past, and her lipase is normal. The remaining portions of her pancreas on CT scan per my review are grossly unremarkable although atrphic in nature.  Unless pancreatic stone is related to recurrent pancreatitis I would not intervene.    RECOMMENDATIONS: An MRCP is planned for tomorrow. The patient will likely benefit from laparoscopic cholecystectomy. I discussed this with her. I will have Dr. Anda KraftMarterre, our day surgeon, follow up on the MRCP as well as discuss timing of cholecystectomy with this patient.   TOTAL TIME: 45 minutes.   ____________________________ Redge GainerMark A. Egbert GaribaldiBird, MD mab:gb D: 11/01/2012 22:11:21 ET T: 11/01/2012 22:39:47 ET JOB#: [id number]  cc: Loraine LericheMark A. Egbert GaribaldiBird, MD, <Dictator> Allona Gondek A Makhayla Mcmurry MD ELECTRONICALLY SIGNED 11/02/2012 7:28

## 2014-08-18 NOTE — Consult Note (Signed)
CC: epigastric and RUQ abd pain.  She has a large calcification in the pancreas that may involve the pancreatic duct.  She does not have acute pancreatitis at this time.  She has some irregularities of the pancreas duct suggesting chronic pancreatitis.  I talked to Dr. Bluford Kaufmannh and he does not think he can remove a pancreatic duct stone this size and she should go to a tertiary med center if the calciffication is a stone and needs to be delt with before the gallbladder is removed.  Since her lipase is not up she may have a calcification partly involving the duct but not obstructing the duct completely.  Then she could have gall bladder removed and the duct delt with afterwards if it was a source of continuing problems.  Will talk to surgical consults.  I spoke to Dr. Anda KraftMarterre and he recommends leave  the pancreatic calcification alone and remove the gall bladder.  Electronic Signatures: Scot JunElliott, Robert T (MD)  (Signed on 08-Jul-14 13:53)  Authored  Last Updated: 08-Jul-14 13:53 by Scot JunElliott, Robert T (MD)

## 2014-08-18 NOTE — Consult Note (Signed)
Brief Consult Note: Diagnosis: acute calculus cholecystitis, pancreatic stone vs calcification.   Patient was seen by consultant.   Consult note dictated.   Recommend to proceed with surgery or procedure.   Recommend further assessment or treatment.   Discussed with Attending MD.   Comments: pancreatic calcification appeared on CT scan 2013.  no previous hx of abdominal pain c/w recurrent pancreatitis, doubt process is of any significance,  await MRCP Dr. Anda KraftMarterre will follow-up on consult Tuesday.  Will need Lap CCY this week.  Electronic Signatures: Natale LayBird, Ladonte Verstraete (MD)  (Signed 07-Jul-14 21:07)  Authored: Brief Consult Note   Last Updated: 07-Jul-14 21:07 by Natale LayBird, Venetta Knee (MD)

## 2014-08-20 NOTE — H&P (Signed)
PATIENT NAME:  Jenna Flowers, Jenna Flowers MR#:  [id number] DATE OF BIRTH:  [birth year]  DATE OF ADMISSION:  07/11/2011  PRIMARY CARE PHYSICIAN: None.   PRIMARY CARE PHYSICIAN: The patient is a 44 year old Caucasian female with no significant past medical history who presented to the hospital with complaints of 5 to 6 day history of not feeling well. According to the patient, she has been having cough, seems to be dry, also rattling in the chest, having painful inspirations, also having shortness of breath as well as fevers as high as 102 and chills. She feels as if she possibly has the flu. She was seen here in the Emergency Room three days ago when she was given amoxicillin to take. She has been taking amoxicillin for the past two days, however, she presented to the Emergency Room again because she did not feel better. In the Emergency Room she was noted to have a heart rate of 120. Her oxygen saturation was 93% on room air. She was given Decadron and breathing treatments but because she overall did not improve significantly, hospitalist services were contacted for admission.   PAST MEDICAL HISTORY: None.   MEDICATIONS:  1. Amoxicillin 500 mg p.o. 3 times daily. 2. Robitussin. 3. Mucinex as needed.   PAST SURGICAL HISTORY: Wart on the feet surgery.   ALLERGIES: Codeine.   FAMILY HISTORY: Hypertension in the patient's mother as well as grandmother on the mother's side. CVA as well as diabetes in the patient's father and grandmother on the father's side. No cancers. No early coronary artery disease.   SOCIAL HISTORY: The patient is divorced, has two kids 45 year old as well as 233-year-old. Smokes 1 pack per day for the past 20 years. No alcohol, however, also smokes pot, cannabis. She works as Building services engineerflorist.  REVIEW OF SYSTEMS: Positive for fevers, fatigue and weakness, achiness all over her body, cough and wheezing, shortness of breath especially on exertion, also chest pains and tightness whenever she exerts  herself or coughs. Also, intermittent nausea, vomiting, and diarrhea for the past few days. Admits of having fevers and chills. Denies any pains, weight loss or gain. In regards to eyes, denies any blurry vision, double vision, glaucoma, or cataracts. ENT: Denies any tinnitus, allergies, epistaxis, sinus pain, dentures, difficulty swallowing. RESPIRATORY: Denies any hemoptysis, asthma, COPD. CARDIOVASCULAR: Denies orthopnea, edema, arrhythmias. Admits of palpitations. GASTROINTESTINAL: Denies any abdominal pain, rectal bleeding, change in bowel habits. GENITOURINARY: Denies dysuria, hematuria, frequency, or incontinence. ENDOCRINOLOGY: Denies any polydipsia, nocturia, thyroid problems, heat or cold intolerance, or thirst. HEMATOLOGIC: Denies any anemia, easy bruising, bleeding, or swollen glands. SKIN: Denies any acne, rash, change in moles. MUSCULOSKELETAL: MUSCULOSKELETAL: Admits of having some weakness as well as achiness all over her body. No numbness, epilepsy, or tremor. PSYCH: No anxiety, insomnia, or depression.   PHYSICAL EXAMINATION:   VITAL SIGNS: On arrival to the hospital, temperature 99.8, pulse 111, respiration rate 22, blood pressure 108/82, saturation 93% on room air.   GENERAL: This is a well developed, well nourished Caucasian female pale and sick looking laying on the stretcher.   HEENT: Pupils equal and reactive to light. Extraocular movements intact. No icterus or conjunctivitis. Normal hearing. Mild pharyngeal erythema. Mucosa is moist.  NECK: Neck did not reveal any masses. Supple, nontender. Thyroid not enlarged. No adenopathy. No JVD or carotid bruits bilaterally. Full range of motion.   LUNGS: Markedly diminished breath sounds bilaterally as well as rales, rhonchi, and wheezing were noted. The patient does have labored inspirations especially whenever she  talks or walks. She has increased effort to breathe. She has no dullness to percussion. She is in mild to moderate  respiratory distress.   CARDIOVASCULAR: S1, S2 appreciated. No murmurs, gallops, or rubs noted. PMI not lateralized. Chest is nontender to palpation. 1+ pedal pulses. No lower extremity edema, calf tenderness, or cyanosis noted.   ABDOMEN: Soft, nontender. Bowel sounds are present. No hepatosplenomegaly or masses were noted.   RECTAL: Deferred.   MUSCULOSKELETAL: Muscle strength able to move all extremities. No cyanosis, degenerative joint disease, or kyphosis. Gait is not tested.   SKIN: Skin did not reveal any rashes, erythema, nodularity, or induration. It was warm and dry to palpation.   LYMPH: No adenopathy in the cervical region.   NEUROLOGICAL: Cranial nerves grossly intact. Sensory is intact. No dysarthria or aphasia. The patient is alert, oriented to time, person, and place, cooperative. Memory is good.   PSYCHIATRIC: No significant confusion, agitation, or depression.   LABORATORY, DIAGNOSTIC, AND RADIOLOGICAL DATA: EKG showed sinus tachy, 110 beats per minute, normal axis, no acute ST-T changes were noted.   Glucose 118, otherwise BMP unremarkable. CBC within normal limits. Flowers-dimer was slightly elevated at 0.54.   The patient's chest x-ray, PA and lateral, 07/11/2011, showed diffuse thickening of interstitial lung markings bilaterally consistent with bilateral interstitial pneumonia. Pulmonary fibrosis could produce similar findings according to the radiologist.   CT scan of chest with IV contrast 07/11/2011 showed no evidence of acute pulmonary embolism. Study was however limited due to presence of respiratory motion artifact secondary to coughing. There are enlarged mediastinal as well as right hilar lymph nodes. The largest lymph node or nodal group lies in the subcarinal region where it measures 2.6 x 2.4 x 5.8 cm. There are patchy interstitial infiltrates within both lungs consistent with interstitial pneumonia or pneumonitis. There is splenomegaly according to the  radiologist.   ASSESSMENT AND PLAN:  1. Systemic inflammatory stress reaction due to pneumonia. Admit patient to medical floor. Get blood cultures as well as sputum cultures if patient is able to produce. Continue patient on Rocephin as well as Zithromax which was already given here in the Emergency Room. Check for Influenza as well. 2. Bacterial pneumonia. As above, continue antibiotics. Get sputum culture. Adjust antibiotics according to culture results.  3. Chronic obstructive pulmonary disease exacerbation due to bacterial pneumonia. Start patient on Symbicort as well as Xopenex inhaler every four hours as well as steroids. Follow clinically. 4. Hyperglycemia likely due to stress. Get hemoglobin A1c.  5. Tachycardia. Check TSH. Very likely hypoxia as well as infection related.  6. Hypoxia. Continue oxygen therapy. No PE on CT scan.  7. Tobacco abuse. Discussed cessation for five minutes. Nicotine replacement therapy will be ordered. The patient was agreeable to try to quit.   TIME SPENT: One hour.    ____________________________ Katharina Caper, MD rv:drc Flowers: 07/11/2011 16:06:57 ET T: 07/11/2011 16:31:22 ET JOB#: [id number]  cc: Katharina Caper, MD, <Dictator> Ximenna Fonseca MD ELECTRONICALLY SIGNED 07/19/2011 10:48

## 2014-08-20 NOTE — Discharge Summary (Signed)
PATIENT NAME:  Jenna Flowers, Jenna Flowers MR#:  [id number] DATE OF BIRTH:  12-05-1970  DATE OF ADMISSION:  07/11/2011 DATE OF DISCHARGE:  07/18/2011  DISCHARGE DIAGNOSES: 1. Hypoxic respiratory failure secondary to bilateral pneumonia.  2. Chronic obstructive pulmonary disease.  DISCHARGE MEDICATIONS:  1. Levaquin 500 mg daily for seven days. 2. Symbicort MDI 2 puffs b.i.Flowers.  3. Prednisone tapering 40 mg to 10 mg as directed. 4. Combivent MDI 2 puffs q.6 hours p.r.n. for wheezing.   DIET: Low sodium diet.   FOLLOW-UP:  1. Follow-up with Dr. Meredeth IdeFleming on April 4th at 1:30 p.m.  2. [facility] appointment on April 2nd at 10:45 a.m.   HOSPITAL COURSE: The patient is a 44 year old female with no past medical history who came in because of cough and also trouble breathing and fever with chills. The patient was given amoxicillin two days before when she was in the ER. When she came in she was tachycardic with heart rate of 120 and sats 93% on room air. The patient was admitted to the hospitalist service for SIRS secondary to pneumonia.   The patient was started on Rocephin and Zithromax. Initial chest x-ray showed diffuse thickening of interstitial markings bilaterally consistent with interstitial pneumonia. CT of the chest showed no PE but enlarged mediastinal and right hilar lymph nodes. The patient's largest lymph node lies in the subcarinal region where it measures 2.6 x 2.4 x 5.8 cm. The patient was admitted to hospitalist service for interstitial pneumonia and SIRS due to pneumonia. She was started on antibiotics along with nebulizers and Symbicort. The patient was seen by Dr. Meredeth IdeFleming, the pulmonologist, and he advised to continue antibiotics along with oxygen, DuoNebs, and Symbicort.   We have checked her blood for Legionella along with C-ANCA and P-ANCA panels. The patient's Mycoplasma titer was negative. Hypersensitive pneumonitis panel with Aspergillus and other bacteria negative. C-ANCA and  P-ANCA are negative. ACE level 33 which is normal range. Rheumatoid arthritis panel is also normal. Legionella titers were also negative in the urine. ANA also is negative.   The patient showed improvement in her symptoms. Blood cultures have been negative. Influenza titers are negative. The patient started to feel better. White count also came down to 12.1 on March 19th. She was afebrile and was saturating in the upper 90's on room air at rest and also exertion. She did not qualify for home oxygen. The patient was [facility] antibiotics, nebulizers, and steroids. We discharged her. Dr. Meredeth IdeFleming will see her regarding her lymphadenopathy and probably repeat a CT of the chest to follow-up on that.   The patient also received one dose of IV Lasix for her trouble breathing and had a good diuretic response. She had an echocardiogram done which showed normal LV systolic function, EF 60%, mild tricuspid regurgitation, RVSP 35 mm. The patient probably is developing early pulmonary hypertension or may have sleep apnea according to Dr. Meredeth IdeFleming and she just needs repeat follow-up as an outpatient.   Initially she was needing high amount of oxygen up to 52% on high flow nasal cannula but gradually she improved and was able to go home without oxygen.   TIME SPENT ON DISCHARGE PREPARATION: More than 30 minutes.   ____________________________ Katha HammingSnehalatha Amiayah Giebel, MD sk:drc Flowers: 07/21/2011 20:21:37 ET T: 07/22/2011 14:27:51 ET JOB#: [id number]  cc: Katha HammingSnehalatha Brodrick Curran, MD, <Dictator> Herbon E. Meredeth IdeFleming, MD [facility] Katha HammingSNEHALATHA Nash Bolls MD ELECTRONICALLY SIGNED 07/24/2011 8:09

## 2014-08-20 NOTE — Discharge Summary (Signed)
PATIENT NAME:  Jenna Flowers, Jenna Flowers MR#:  [id number] DATE OF BIRTH:  [birth year]  DATE OF ADMISSION:  07/11/2011 DATE OF DISCHARGE:  07/18/2011  ADDENDUM: The patient was a smoker before. We advised her to quit smoking and offered assistance.  ____________________________ Katha HammingSnehalatha Kynsie Falkner, MD sk:slb Flowers: 07/21/2011 20:22:00 ET T: 07/22/2011 14:28:05 ET JOB#: [id number]  cc: Katha HammingSnehalatha Bryann Mcnealy, MD, <Dictator> Katha HammingSNEHALATHA Tamana Hatfield MD ELECTRONICALLY SIGNED 07/24/2011 8:09

## 2014-08-27 NOTE — H&P (Signed)
PATIENT NAME:  Jenna Flowers, Jenna Flowers MR#:  [id number] DATE OF BIRTH:  [birth year]  DATE OF ADMISSION:  07/01/2014  REFERRING PHYSICIAN: Dr. Carollee Massed.   FAMILY PHYSICIAN: Nonlocal.   REASON FOR ADMISSION: Worsening pneumonia.   HISTORY OF PRESENT ILLNESS: The patient is a 44 year old female with a history of tobacco abuse and migraine headaches, who was seen here three days ago with pneumonia and sent home on Zithromax and prednisone.  She presents now with persistent cough and fever with worsening shortness of breath.  In the Emergency Room, the patient was noted to be febrile and tachycardic. Chest x-ray revealed worsening pneumonia despite outpatient therapy. She is now admitted for further evaluation.   PAST MEDICAL HISTORY:  1.  Chronic anemia.  2.  Migraine headaches.  3.  Tobacco abuse.  4.  Recent pneumonia.   MEDICATIONS:  1.  Zithromax as directed.  2.  ProAir 2 puffs q.i.d.  3.  Prednisone taper as directed. 4.  Tessalon Perles 100 mg 1 to 2 every eight hours as needed for cough.   ALLERGIES: AMOXICILLIN AND CODEINE.   SOCIAL HISTORY: The patient is still smokes at least 1 pack per day. Denies alcohol abuse.   FAMILY HISTORY: Positive for hypertension and coronary artery disease, but otherwise unremarkable.   REVIEW OF SYSTEMS:    CONSTITUTIONAL: The patient has had fever but no change in weight.  EYES: No blurred or double vision. No glaucoma.  ENT: No tinnitus or hearing loss. No nasal discharge or bleeding. No difficulty swallowing.  RESPIRATORY: The patient has had cough and wheezing but denies hemoptysis. No painful respiration.  CARDIOVASCULAR: No chest pain or orthopnea. No palpitations. No syncope.  GASTROINTESTINAL: The patient has had nausea and vomiting but no diarrhea or abdominal pain. No change in bowel habits.  GENITOURINARY: No dysuria or hematuria. No incontinence.  ENDOCRINE: No polyuria or polydipsia. No heat or cold intolerance.  HEMATOLOGIC: The  patient denies anemia, easy bruising, or bleeding.  LYMPHATIC: No swollen glands.  MUSCULOSKELETAL: The patient denies pain in her neck, back, shoulders, knees, or hips. No gout.  NEUROLOGIC: No numbness. Denies stroke or seizures.  PSYCHOLOGICAL: The patient denies anxiety, insomnia or depression.   PHYSICAL EXAMINATION:  GENERAL: The patient is acutely ill-appearing, in mild distress.  VITAL SIGNS: Remarkable for a blood pressure of 100/55, with a heart rate of 99, respiratory rate of 24, temperature of 100.3, sat 89% on room air.  HEENT: Normocephalic, atraumatic. Pupils equal and reactive to light and accommodation. Extraocular movements are intact. Sclerae are anicteric. Conjunctivae are clear.  Oropharynx is dry, but clear.  NECK: Supple without JVD. No adenopathy or thyromegaly was noted.  LUNGS: Reveal diffuse rhonchi, without wheezes or rales. No dullness. Respiratory effort is increased.  CARDIAC: Exam has a rapid rate with a regular rhythm. Normal S1 and S2. No significant rubs or gallops. PMI is nondisplaced. Chest wall is nontender.  ABDOMEN: Soft, nontender, with normoactive bowel sounds. No organomegaly or masses were appreciated. No hernias or bruits were noted.  EXTREMITIES: Without clubbing, cyanosis, or edema. Pulses were 2+ bilaterally.  SKIN: Warm and dry without rash or lesions.  NEUROLOGIC: Revealed cranial nerves II through XII grossly intact. Deep tendon reflexes were symmetric. Motor and sensory exam is nonfocal.  PSYCHIATRIC: Revealed a patient who is alert and oriented to person, place, and time. She was cooperative and used good judgment.   LABORATORY DATA: White count was 9.9 with hemoglobin of 13.2. Glucose 85 with a BUN of  eight, creatinine of 0.95 with a sodium of 136, and potassium of 3.2. Chest x-ray revealed progression of pneumonia.   ASSESSMENT:  1.  Progressive pneumonia despite outpatient therapy.  2.  Acute respiratory failure with hypoxia.  3.   Tachycardia.  4.  Fever.  5.  Hypokalemia.   PLAN: The patient will be admitted to the floor with IV steroids, IV antibiotics, oxygen, and DuoNeb SVNs. Blood cultures have been sent. We will wean oxygen as tolerated. Repeat chest x-ray and labs in the morning. Further treatment and evaluation will depend upon the patient's progress. We will begin IV fluids at this time with potassium supplementation.   TOTAL TIME SPENT ON THIS PATIENT: 50 minutes.     ____________________________ Duane LopeJeffrey D. Judithann SheenSparks, MD jds:at D: 07/01/2014 19:19:29 ET T: 07/01/2014 20:06:52 ET JOB#: [id number]  cc: Duane LopeJeffrey D. Judithann SheenSparks, MD, <Dictator> Shatima Zalar Rodena Medin Nil Bolser MD ELECTRONICALLY SIGNED 07/01/2014 20:39

## 2014-08-27 NOTE — Discharge Summary (Signed)
PATIENT NAME:  Jenna Flowers, KOC MR#:  [id number] DATE OF BIRTH:  10-03-1970  DATE OF ADMISSION:  07/01/2014 DATE OF DISCHARGE:  07/08/2014  DISCHARGE DIAGNOSES:  1.  Acute respiratory failure.  2.  Chronic obstructive pulmonary disease exacerbation.  3.  Bilateral pneumonia.  4.  Smoking.  CONDITION ON DISCHARGE: Stable.   CODE STATUS: Full.   MEDICATIONS ON DISCHARGE:  1.  Tessalon Perles 100 mg oral capsule 1 to 2 every 8 hours as needed for cough.  2.  Prednisone 10 mg oral tablet start 60 and taper by 10 daily until complete.  4.  Advair Diskus 250 + 50 mg inhaled 2 times a day.  5.  Guaifenesin 600 mg oral tablet 2 times a day for 3 days. 6.  Ceftin 500 mg oral tablet 2 times a day for 5 days.  DIET ON DISCHARGE:  Regular.   DIET CONSISTENCY:  Regular.   ACTIVITY: As tolerated.   TIMEFRAME TO FOLLOW:  Within 1 to 2 weeks with Dr. Meredeth Ide.   HISTORY OF PRESENTING ILLNESS: A 44 year old female with history of tobacco abuse and migraine headache who was seen 3 days ago with pneumonia and sent on azithromycin and prednisone, but presented with persistent cough, worsening fever, and shortness of breath. She was noted to be febrile and tachycardic.  On chest x-ray, there was worsening of pneumonia despite of outpatient therapy, and so admitted for further evaluation and management.   HOSPITAL COURSE AND SAY:  1.  After admission, the patient was given IV Rocephin and azithromycin initially, but she was not able to give any sputum samples.  Even on those antibiotics, she had worsening in the condition and she required actually high flow nasal cannula oxygen supplementation. CT scan of chest was done which showed diffuse bilateral pneumonia, possibly atypical, and so we switched her antibiotics to vancomycin and Zosyn to have broader coverage, and ordered influenza and HIV which came out negative.  Called pulmonary consult.  Dr. Meredeth Ide agreed with the plan. We also added incentive  spirometry in her steroid, IV Solu-Medrol, and nebulizer treatment.  As the patient was a smoker, there might be a component of COPD also.  After having all this therapy, the patient had significant and gradual improvement in her condition over 4-5 days and she was able to come off the oxygen and walk on the room air without dropping her oxygen saturation, so we discharged her home to finish further antibiotic therapy orally.  2.  Tachycardia which was due to hypoxemia on admission and it resolved.  3.  Fever was due to pneumonia.  4.  Hypokalemia was replaced in the hospital.  PULMONARY CONSULTATION:  Dr. Meredeth Ide.   IMPORTANT LABORATORY REPORTS IN THE HOSPITAL:   1.  Chest x-ray PA and lateral in the ER on March 2 showed mild bronchitic changes.   2.  On presentation on March 5, WBC count 9.9, hemoglobin 13.2, platelet count 158,000, MCV 85, creatinine 0.95, sodium 136, potassium 3.2, chloride is 103.  3.  Chest x-ray PA and lateral on ER presentation showed progression of lower lobe predominant peribronchial central airway thickening suspicious for viral or mycoplasma pneumonia.  4.  Blood culture showed no growth which was done on admission.  5.  Her admission oxygen saturation was 89% on room air. 6.  Chest x-ray PA and lateral on March 6 showed no significant change in bilateral interstitial infiltrate with patchy airspace disease, and on March 7 it showed slightly increased interstitial infiltrate,  question atypical infection.  Because of worsening in condition, ABG was also done which showed pH 7.44, pCO2 34, pO2 58 and FiO2 of 79 on March 7.  CT scan of the chest without contrast was done which showed extensive bilateral infiltrate and edema. No new mediastinal or hilar adenopathy is evident.  Legionella antigen was negative. Influenza A and B are negative. WBC count went up to 16,000 on followup. BNP was 3000.  Mycoplasma pneumoniae IgM antibody is less than 770.  Hepatitis A and B nonreactive.  Echocardiogram was done, showed left ventricular ejection fraction 55 to 60, normal global left ventricular systolic function.   Total time spent on this discharge is 40 minutes.   ____________________________ Hope PigeonVaibhavkumar G. Elisabeth PigeonVachhani, MD vgv:sp D: 07/11/2014 11:17:21 ET T: 07/11/2014 13:28:27 ET JOB#: [id number]  cc: Hope PigeonVaibhavkumar G. Elisabeth PigeonVachhani, MD, <Dictator> Meredeth IdeFleming, M.D. Heath GoldVAIBHAVKUMAR [facility] NorthVACHHANI MD ELECTRONICALLY SIGNED 07/18/2014 12:15

## 2015-02-01 ENCOUNTER — Other Ambulatory Visit: Payer: Self-pay | Admitting: Internal Medicine

## 2015-02-01 DIAGNOSIS — Z1239 Encounter for other screening for malignant neoplasm of breast: Secondary | ICD-10-CM

## 2015-02-15 ENCOUNTER — Ambulatory Visit: Admission: RE | Admit: 2015-02-15 | Payer: Self-pay | Source: Ambulatory Visit

## 2015-05-03 ENCOUNTER — Encounter: Payer: Self-pay | Admitting: Emergency Medicine

## 2015-05-03 ENCOUNTER — Emergency Department: Payer: Managed Care, Other (non HMO)

## 2015-05-03 ENCOUNTER — Emergency Department
Admission: EM | Admit: 2015-05-03 | Discharge: 2015-05-03 | Disposition: A | Discharge status: Home / Self Care | Payer: Managed Care, Other (non HMO) | Attending: Emergency Medicine | Admitting: Emergency Medicine

## 2015-05-03 DIAGNOSIS — F17210 Nicotine dependence, cigarettes, uncomplicated: Secondary | ICD-10-CM | POA: Insufficient documentation

## 2015-05-03 DIAGNOSIS — Z880 Allergy status to penicillin: Secondary | ICD-10-CM | POA: Diagnosis not present

## 2015-05-03 DIAGNOSIS — K859 Acute pancreatitis without necrosis or infection, unspecified: Secondary | ICD-10-CM | POA: Diagnosis not present

## 2015-05-03 DIAGNOSIS — R109 Unspecified abdominal pain: Secondary | ICD-10-CM | POA: Diagnosis present

## 2015-05-03 DIAGNOSIS — Z3202 Encounter for pregnancy test, result negative: Secondary | ICD-10-CM | POA: Insufficient documentation

## 2015-05-03 LAB — CBC WITH DIFFERENTIAL/PLATELET
BASOS ABS: 0 10*3/uL (ref 0–0.1)
BASOS PCT: 0 %
EOS ABS: 0 10*3/uL (ref 0–0.7)
EOS PCT: 0 %
HEMATOCRIT: 42.6 % (ref 35.0–47.0)
Hemoglobin: 14 g/dL (ref 12.0–16.0)
Lymphocytes Relative: 24 %
Lymphs Abs: 3.1 10*3/uL (ref 1.0–3.6)
MCH: 27.9 pg (ref 26.0–34.0)
MCHC: 32.9 g/dL (ref 32.0–36.0)
MCV: 84.6 fL (ref 80.0–100.0)
MONO ABS: 0.8 10*3/uL (ref 0.2–0.9)
MONOS PCT: 6 %
NEUTROS ABS: 8.8 10*3/uL — AB (ref 1.4–6.5)
Neutrophils Relative %: 70 %
PLATELETS: 245 10*3/uL (ref 150–440)
RBC: 5.03 MIL/uL (ref 3.80–5.20)
RDW: 14.7 % — AB (ref 11.5–14.5)
WBC: 12.8 10*3/uL — ABNORMAL HIGH (ref 3.6–11.0)

## 2015-05-03 LAB — URINALYSIS COMPLETE WITH MICROSCOPIC (ARMC ONLY)
BILIRUBIN URINE: NEGATIVE
GLUCOSE, UA: NEGATIVE mg/dL
LEUKOCYTES UA: NEGATIVE
NITRITE: NEGATIVE
PH: 7 (ref 5.0–8.0)
Protein, ur: NEGATIVE mg/dL
SPECIFIC GRAVITY, URINE: 1.003 — AB (ref 1.005–1.030)

## 2015-05-03 LAB — LIPASE, BLOOD: Lipase: 114 U/L — ABNORMAL HIGH (ref 11–51)

## 2015-05-03 LAB — COMPREHENSIVE METABOLIC PANEL
ALBUMIN: 4.3 g/dL (ref 3.5–5.0)
ALT: 12 U/L — ABNORMAL LOW (ref 14–54)
ANION GAP: 10 (ref 5–15)
AST: 13 U/L — AB (ref 15–41)
Alkaline Phosphatase: 57 U/L (ref 38–126)
BILIRUBIN TOTAL: 0.6 mg/dL (ref 0.3–1.2)
BUN: 7 mg/dL (ref 6–20)
CHLORIDE: 104 mmol/L (ref 101–111)
CO2: 23 mmol/L (ref 22–32)
Calcium: 9.3 mg/dL (ref 8.9–10.3)
Creatinine, Ser: 0.75 mg/dL (ref 0.44–1.00)
GFR calc Af Amer: >60 mL/min (ref >60)
GFR calc non Af Amer: >60 mL/min (ref >60)
GLUCOSE: 107 mg/dL — AB (ref 65–99)
POTASSIUM: 4.1 mmol/L (ref 3.5–5.1)
SODIUM: 137 mmol/L (ref 135–145)
TOTAL PROTEIN: 7.6 g/dL (ref 6.5–8.1)

## 2015-05-03 LAB — POCT PREGNANCY, URINE: Preg Test, Ur: NEGATIVE

## 2015-05-03 MED ORDER — GI COCKTAIL ~~LOC~~
30.0000 mL | Freq: Once | ORAL | Status: AC
  Administered 2015-05-03: 30 mL via ORAL
  Filled 2015-05-03: qty 30

## 2015-05-03 MED ORDER — MORPHINE SULFATE (PF) 4 MG/ML IV SOLN
4.0000 mg | Freq: Once | INTRAVENOUS | Status: AC
  Administered 2015-05-03: 4 mg via INTRAVENOUS
  Filled 2015-05-03: qty 1

## 2015-05-03 MED ORDER — OXYCODONE-ACETAMINOPHEN 5-325 MG PO TABS: 2.0000 | ORAL_TABLET | Freq: Four times a day (QID) | ORAL | Status: DC | PRN

## 2015-05-03 MED ORDER — MORPHINE SULFATE (PF) 4 MG/ML IV SOLN
INTRAVENOUS | Status: AC
  Administered 2015-05-03: 4 mg via INTRAVENOUS
  Filled 2015-05-03: qty 1

## 2015-05-03 MED ORDER — OXYCODONE-ACETAMINOPHEN 5-325 MG PO TABS
2.0000 | ORAL_TABLET | Freq: Once | ORAL | Status: AC
  Administered 2015-05-03: 2 via ORAL
  Filled 2015-05-03: qty 2

## 2015-05-03 MED ORDER — SODIUM CHLORIDE 0.9 % IV SOLN
Freq: Once | INTRAVENOUS | Status: AC
  Administered 2015-05-03: 09:00:00 via INTRAVENOUS

## 2015-05-03 MED ORDER — PANTOPRAZOLE SODIUM 40 MG PO TBEC: 40.0000 mg | DELAYED_RELEASE_TABLET | Freq: Every day | ORAL | Status: DC

## 2015-05-03 MED ORDER — MORPHINE SULFATE (PF) 4 MG/ML IV SOLN
4.0000 mg | Freq: Once | INTRAVENOUS | Status: AC
  Administered 2015-05-03: 4 mg via INTRAVENOUS

## 2015-05-03 MED ORDER — PANTOPRAZOLE SODIUM 40 MG PO TBEC
40.0000 mg | DELAYED_RELEASE_TABLET | Freq: Once | ORAL | Status: AC
  Administered 2015-05-03: 40 mg via ORAL
  Filled 2015-05-03: qty 1

## 2015-05-03 NOTE — ED Notes (Signed)
Discharge instructions explained to patient. Pt verbalized understanding.

## 2015-05-03 NOTE — ED Notes (Signed)
Pain is relieved. Pt reports feeling comfortable with discharge. Pt is alert, awake and ambulatory with a steady gait.  Pt aware of follow up appointment at 10 am tomorrow.

## 2015-05-03 NOTE — Discharge Instructions (Signed)

## 2015-05-03 NOTE — ED Notes (Signed)
Pt presents with c/o abdominal pain since Tuesday. Pt denies nausea, vomiting, and diarrhea at this time. Pt states she hasn't "really eaten anything since Tuesday". Pt states she completed a coarse of antibiotics on Sunday. Pt denies fevers at this time. Pt states pain radiates through her stomach to her back.

## 2015-05-03 NOTE — ED Provider Notes (Signed)
[facility] Emergency Department Provider Note     Time seen: ----------------------------------------- 8:33 AM on 05/03/2015 -----------------------------------------    I have reviewed the triage vital signs and the nursing notes.   HISTORY  Chief Complaint Abdominal Pain    HPI Jenna Flowers is a 45 y.o. female who presents ER complaining of abdominal pain since Tuesday. Patient denies nausea vomiting or diarrhea but does have poor appetite. Patient states she completed a course of antibiotics on Sunday.States she had this type of pain in August of last year but did not receive a formal diagnosis, nothing makes her symptoms better.    History reviewed. No pertinent past medical history.  Patient Active Problem List   Diagnosis Date Noted  . OTHER ACUTE SINUSITIS 08/10/2009    Past Surgical History  Procedure Laterality Date  . Cholecystectomy      Allergies Codeine sulfate and Penicillins  Social History Social History  Substance Use Topics  . Smoking status: Current Every Day Smoker -- 1.00 packs/day    Types: Cigarettes  . Smokeless tobacco: Never Used  . Alcohol Use: No    Review of Systems Constitutional: Negative for fever. Eyes: Negative for visual changes. ENT: Negative for sore throat. Cardiovascular: Negative for chest pain. Respiratory: Negative for shortness of breath. Gastrointestinal: Positive for abdominal pain, negative for vomiting and diarrhea Genitourinary: Negative for dysuria. Musculoskeletal: Negative for back pain. Skin: Negative for rash. Neurological: Negative for headaches, focal weakness or numbness.  10-point ROS otherwise negative.  ____________________________________________   PHYSICAL EXAM:  VITAL SIGNS: ED Triage Vitals  Enc Vitals Group     BP 05/03/15 0823 135/79 mmHg     Pulse Rate 05/03/15 0823 93     Resp 05/03/15 0823 18     Temp 05/03/15 0823 98.2 F (36.8 C)     Temp  Source 05/03/15 0823 Oral     SpO2 05/03/15 0823 98 %     Weight 05/03/15 0823 170 lb (77.111 kg)     Height 05/03/15 0823 5' (1.524 m)     Head Cir --      Peak Flow --      Pain Score 05/03/15 0823 8     Pain Loc --      Pain Edu? --      Excl. in GC? --     Constitutional: Alert and oriented. Mild distress Eyes: Conjunctivae are normal. PERRL. Normal extraocular movements. ENT   Head: Normocephalic and atraumatic.   Nose: No congestion/rhinnorhea.   Mouth/Throat: Mucous membranes are moist.   Neck: No stridor. Cardiovascular: Normal rate, regular rhythm. Normal and symmetric distal pulses are present in all extremities. No murmurs, rubs, or gallops. Respiratory: Normal respiratory effort without tachypnea nor retractions. Breath sounds are clear and equal bilaterally. No wheezes/rales/rhonchi. Gastrointestinal: Gastric tenderness, hyperactive bowel sounds.  Musculoskeletal: Nontender with normal range of motion in all extremities. No joint effusions.  No lower extremity tenderness nor edema. Neurologic:  Normal speech and language. No gross focal neurologic deficits are appreciated. Speech is normal. No gait instability. Skin:  Skin is warm, dry and intact. No rash noted. Psychiatric: Mood and affect are normal. Speech and behavior are normal. Patient exhibits appropriate insight and judgment. ____________________________________________  ED COURSE:  Pertinent labs & imaging results that were available during my care of the patient were reviewed by me and considered in my medical decision making (see chart for details). Patient with epigastric tenderness, likely peptic ulcer disease. He receives IV morphine,  GI cocktail basic labs. ____________________________________________    LABS (pertinent positives/negatives)  Labs Reviewed  CBC WITH DIFFERENTIAL/PLATELET - Abnormal; Notable for the following:    WBC 12.8 (*)    RDW 14.7 (*)    Neutro Abs 8.8 (*)    All  other components within normal limits  COMPREHENSIVE METABOLIC PANEL - Abnormal; Notable for the following:    Glucose, Bld 107 (*)    AST 13 (*)    ALT 12 (*)    All other components within normal limits  LIPASE, BLOOD - Abnormal; Notable for the following:    Lipase 114 (*)    All other components within normal limits  URINALYSIS COMPLETEWITH MICROSCOPIC ([facility] ONLY) - Abnormal; Notable for the following:    Color, Urine STRAW (*)    APPearance HAZY (*)    Ketones, ur TRACE (*)    Specific Gravity, Urine 1.003 (*)    Hgb urine dipstick 1+ (*)    Bacteria, UA RARE (*)    Squamous Epithelial / LPF 6-30 (*)    All other components within normal limits  POC URINE PREG, ED  POCT PREGNANCY, URINE    RADIOLOGY Images were viewed by me   IMPRESSION: Gallbladder absent. Persistent calculus in the pancreatic duct near the junction of the head and body of the pancreas with pancreatic duct dilatation to 7 mm, stable. Study otherwise unremarkable.  ____________________________________________  FINAL ASSESSMENT AND PLAN  Epigastric pain, pancreatitis  Plan: Patient with labs and imaging as dictated above. Patient is in no acute distress, pain is improved. Have discussed with Dr. Mechele CollinElliott on call for gastroenterology will see the patient tomorrow at 10 AM. She will be started on Protonix, she's given Percocet for pain. It is reassuring that her ultrasound looked the same 2 years ago.   [name], Glendell Schlottman E, MD   Emily FilbertJonathan E Margia Wiesen, MD 05/03/15 [phone]

## 2015-05-03 NOTE — ED Notes (Addendum)
Delay in discharge due to pain.  Requesting to wait.  Pt tearful due to pain

## 2015-06-11 ENCOUNTER — Ambulatory Visit: Payer: PRIVATE HEALTH INSURANCE

## 2015-06-13 ENCOUNTER — Ambulatory Visit
Admission: RE | Admit: 2015-06-13 | Discharge: 2015-06-13 | Disposition: A | Discharge status: Home / Self Care | Payer: Managed Care, Other (non HMO) | Source: Ambulatory Visit | Attending: Internal Medicine | Admitting: Internal Medicine

## 2015-06-13 DIAGNOSIS — Z1231 Encounter for screening mammogram for malignant neoplasm of breast: Secondary | ICD-10-CM | POA: Diagnosis present

## 2015-06-13 DIAGNOSIS — Z1239 Encounter for other screening for malignant neoplasm of breast: Secondary | ICD-10-CM

## 2015-06-15 ENCOUNTER — Other Ambulatory Visit: Payer: Self-pay | Admitting: Internal Medicine

## 2015-06-15 DIAGNOSIS — R928 Other abnormal and inconclusive findings on diagnostic imaging of breast: Secondary | ICD-10-CM

## 2015-06-22 ENCOUNTER — Other Ambulatory Visit: Payer: Self-pay | Admitting: Internal Medicine

## 2015-06-22 ENCOUNTER — Ambulatory Visit
Admission: RE | Admit: 2015-06-22 | Discharge: 2015-06-22 | Disposition: A | Discharge status: Home / Self Care | Payer: Managed Care, Other (non HMO) | Source: Ambulatory Visit | Attending: Internal Medicine | Admitting: Internal Medicine

## 2015-06-22 DIAGNOSIS — R928 Other abnormal and inconclusive findings on diagnostic imaging of breast: Secondary | ICD-10-CM

## 2015-06-22 DIAGNOSIS — N6489 Other specified disorders of breast: Secondary | ICD-10-CM | POA: Insufficient documentation

## 2016-06-24 ENCOUNTER — Other Ambulatory Visit: Payer: Self-pay | Admitting: Internal Medicine

## 2016-06-24 DIAGNOSIS — Z1231 Encounter for screening mammogram for malignant neoplasm of breast: Secondary | ICD-10-CM

## 2016-07-23 ENCOUNTER — Ambulatory Visit
Admission: RE | Admit: 2016-07-23 | Discharge: 2016-07-23 | Disposition: A | Discharge status: Home / Self Care | Payer: Managed Care, Other (non HMO) | Source: Ambulatory Visit | Attending: Internal Medicine | Admitting: Internal Medicine

## 2016-07-23 DIAGNOSIS — Z1231 Encounter for screening mammogram for malignant neoplasm of breast: Secondary | ICD-10-CM

## 2016-10-11 IMAGING — US US ABDOMEN LIMITED
1 series · 14 of 25 positions shown · non-contrast
Comparison: October 31, 2012

CLINICAL DATA: Right upper quadrant pain for 2 days. History of
pancreatitis

EXAM:
US ABDOMEN LIMITED - RIGHT UPPER QUADRANT

[Series 1: us abdomen limited · 0.23mm/px · 14 of 43 slices shown]
[im 1/43]
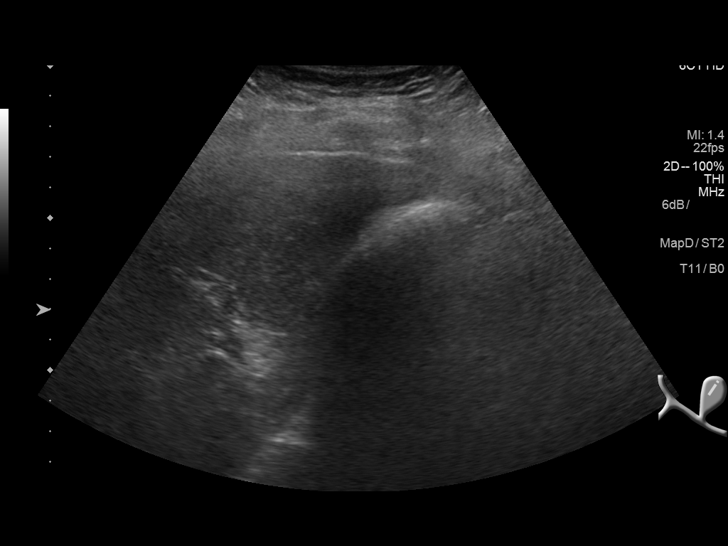
[im 4/43]
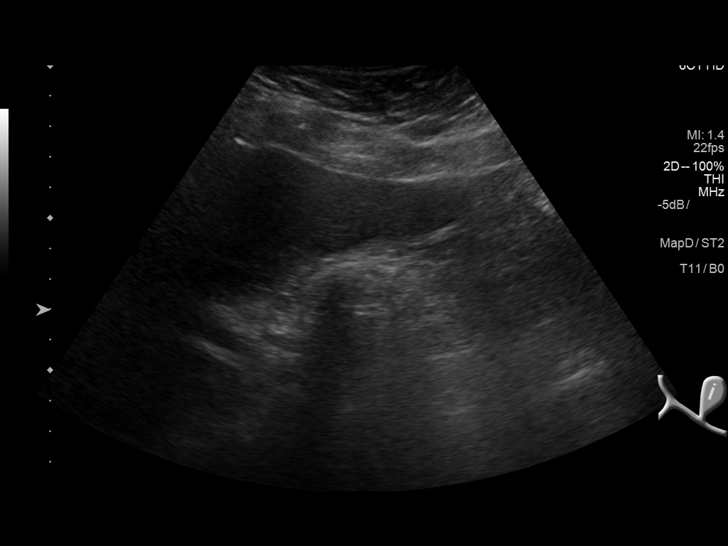
[im 8/43]
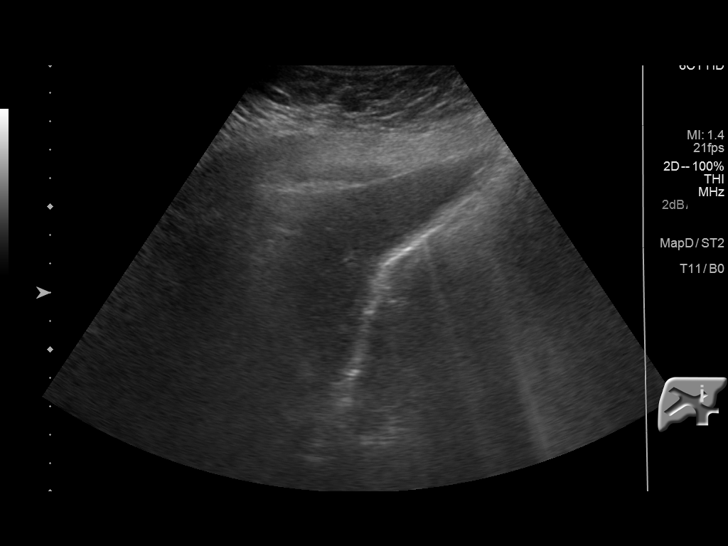
[im 11/43]
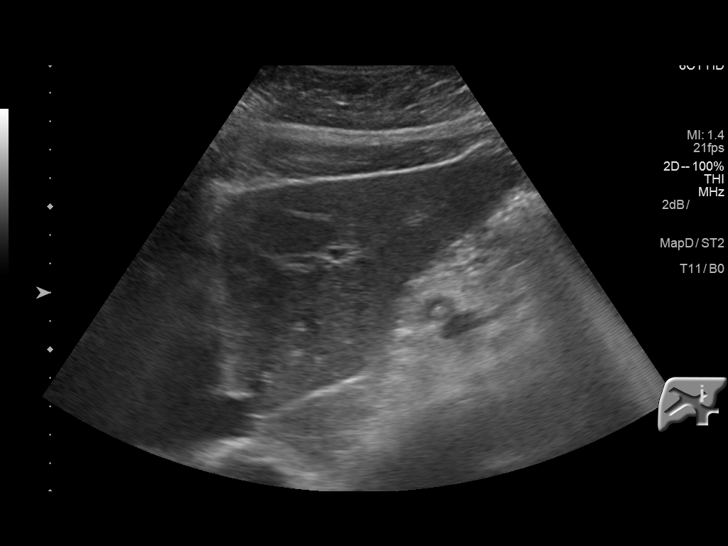
[im 15/43]
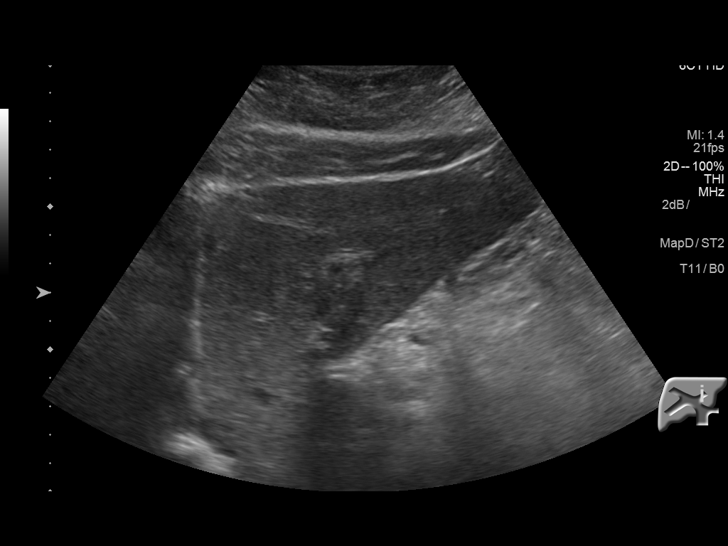
[im 16/43]
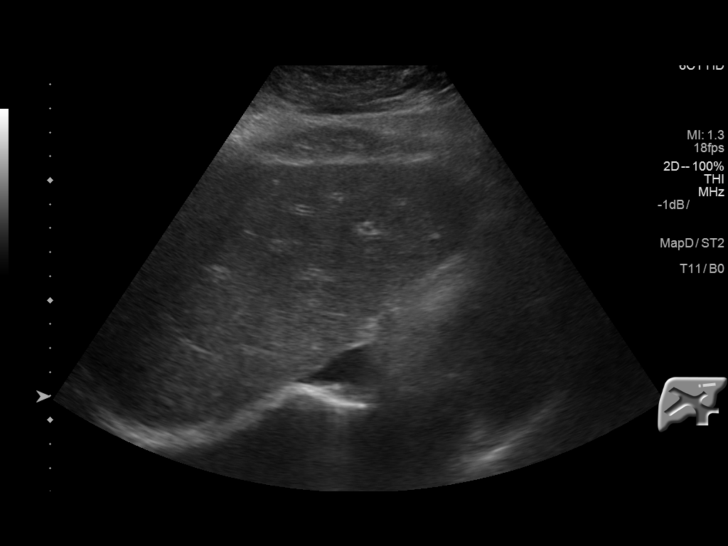
[im 20/43]
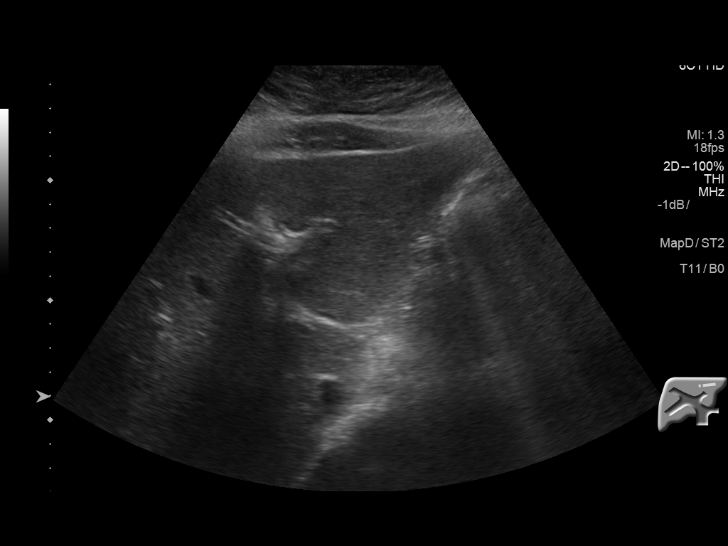
[im 23/43]
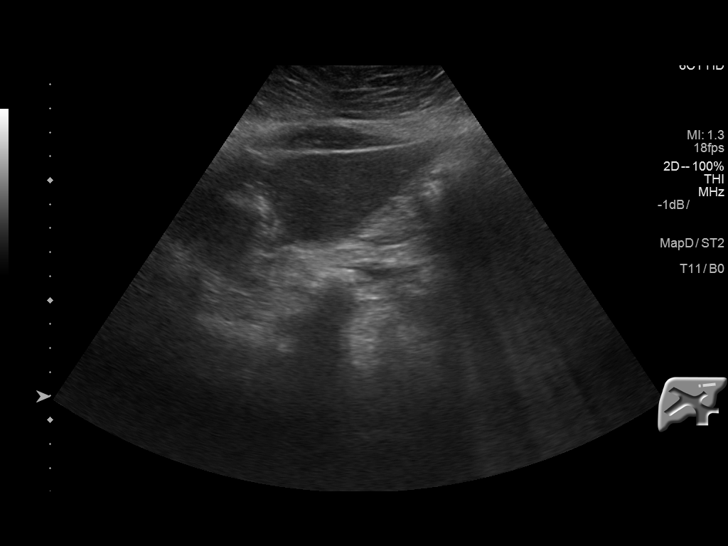
[im 27/43]
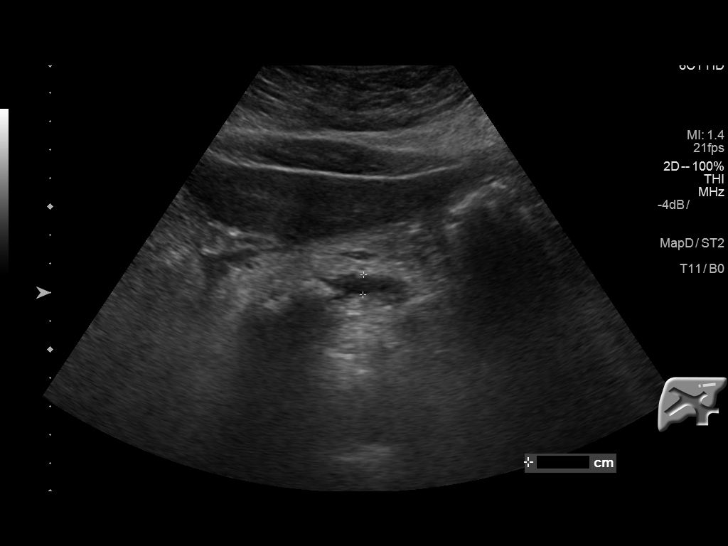
[im 29/43]
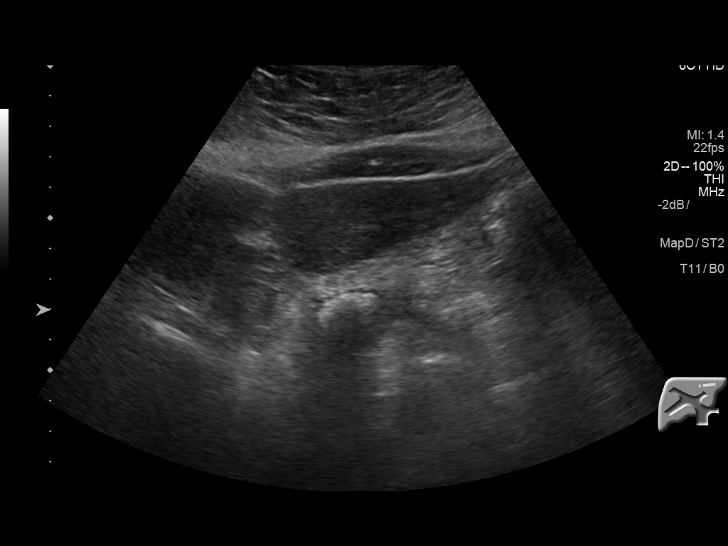
[im 32/43]
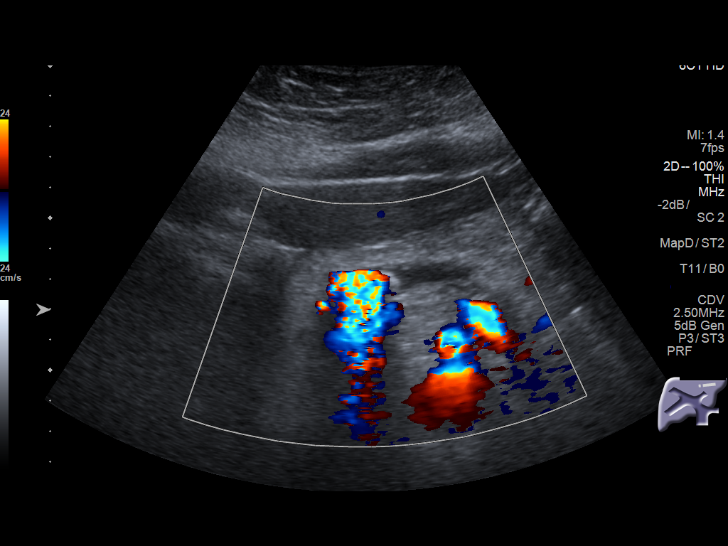
[im 36/43]
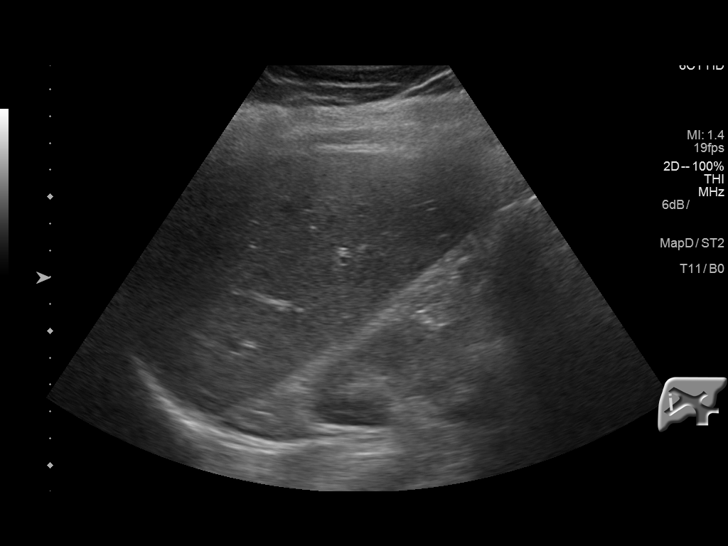
[im 39/43]
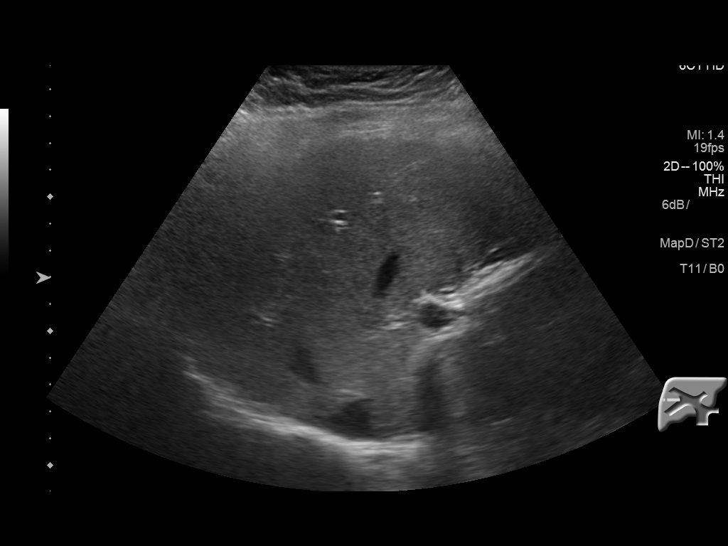
[im 43/43]
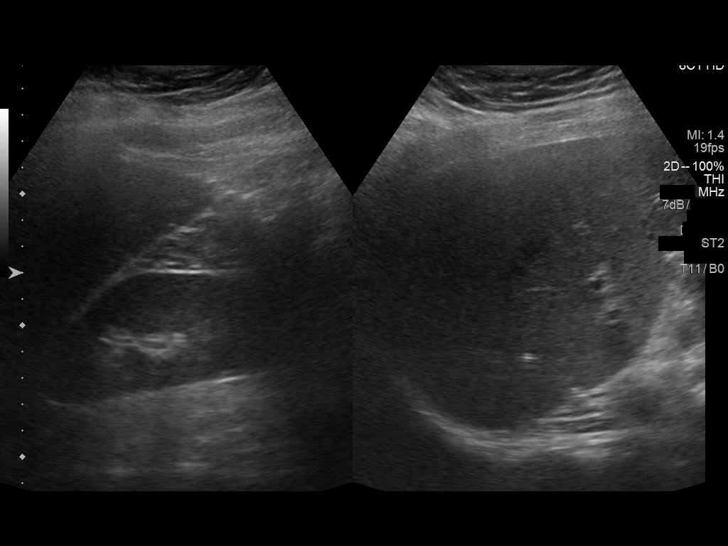

[14 of 25 positions shown; findings below may reference images not displayed]

FINDINGS: Gallbladder:

Surgically absent.

Common bile duct:

Diameter: 3 mm. There is no intrahepatic or extrahepatic biliary
duct dilatation.

Liver:

No focal lesion identified. Within normal limits in parenchymal
echogenicity.

A 2 cm calculus in the pancreatic duct with pancreatic duct
dilatation is again noted, not significantly changed compared to the
previous study.
IMPRESSION: Gallbladder absent. Persistent calculus in the pancreatic duct near
the junction of the head and body of the pancreas with pancreatic
duct dilatation to 7 mm, stable. Study otherwise unremarkable.

## 2017-08-27 ENCOUNTER — Other Ambulatory Visit: Payer: Self-pay | Admitting: Internal Medicine

## 2017-08-27 DIAGNOSIS — Z1231 Encounter for screening mammogram for malignant neoplasm of breast: Secondary | ICD-10-CM

## 2017-09-18 ENCOUNTER — Ambulatory Visit
Admission: RE | Admit: 2017-09-18 | Discharge: 2017-09-18 | Disposition: A | Discharge status: Home / Self Care | Payer: Managed Care, Other (non HMO) | Source: Ambulatory Visit | Attending: Internal Medicine | Admitting: Internal Medicine

## 2017-09-18 DIAGNOSIS — Z1231 Encounter for screening mammogram for malignant neoplasm of breast: Secondary | ICD-10-CM | POA: Insufficient documentation

## 2019-02-25 ENCOUNTER — Other Ambulatory Visit: Payer: Self-pay | Admitting: Internal Medicine

## 2019-02-25 DIAGNOSIS — Z1231 Encounter for screening mammogram for malignant neoplasm of breast: Secondary | ICD-10-CM

## 2020-03-16 ENCOUNTER — Encounter: Payer: Self-pay | Admitting: Emergency Medicine

## 2020-03-16 ENCOUNTER — Other Ambulatory Visit: Payer: Self-pay

## 2020-03-16 ENCOUNTER — Emergency Department
Admission: EM | Admit: 2020-03-16 | Discharge: 2020-03-16 | Disposition: A | Discharge status: Home / Self Care | Payer: Managed Care, Other (non HMO) | Attending: Student in an Organized Health Care Education/Training Program | Admitting: Student in an Organized Health Care Education/Training Program

## 2020-03-16 ENCOUNTER — Emergency Department: Payer: Managed Care, Other (non HMO)

## 2020-03-16 DIAGNOSIS — R319 Hematuria, unspecified: Secondary | ICD-10-CM | POA: Diagnosis not present

## 2020-03-16 DIAGNOSIS — F17210 Nicotine dependence, cigarettes, uncomplicated: Secondary | ICD-10-CM | POA: Diagnosis not present

## 2020-03-16 DIAGNOSIS — I10 Essential (primary) hypertension: Secondary | ICD-10-CM | POA: Diagnosis not present

## 2020-03-16 DIAGNOSIS — M545 Low back pain, unspecified: Secondary | ICD-10-CM | POA: Diagnosis present

## 2020-03-16 HISTORY — DX: Essential (primary) hypertension: I10

## 2020-03-16 LAB — CBC WITH DIFFERENTIAL/PLATELET
Abs Immature Granulocytes: 0.03 10*3/uL (ref 0.00–0.07)
Basophils Absolute: 0 10*3/uL (ref 0.0–0.1)
Basophils Relative: 0 %
Eosinophils Absolute: 0.1 10*3/uL (ref 0.0–0.5)
Eosinophils Relative: 1 %
HCT: 39.5 % (ref 36.0–46.0)
Hemoglobin: 12.8 g/dL (ref 12.0–15.0)
Immature Granulocytes: 0 %
Lymphocytes Relative: 31 %
Lymphs Abs: 2.5 10*3/uL (ref 0.7–4.0)
MCH: 28.3 pg (ref 26.0–34.0)
MCHC: 32.4 g/dL (ref 30.0–36.0)
MCV: 87.4 fL (ref 80.0–100.0)
Monocytes Absolute: 0.4 10*3/uL (ref 0.1–1.0)
Monocytes Relative: 6 %
Neutro Abs: 4.8 10*3/uL (ref 1.7–7.7)
Neutrophils Relative %: 62 %
Platelets: 262 10*3/uL (ref 150–400)
RBC: 4.52 MIL/uL (ref 3.87–5.11)
RDW: 14.6 % (ref 11.5–15.5)
WBC: 7.9 10*3/uL (ref 4.0–10.5)
nRBC: 0 % (ref 0.0–0.2)

## 2020-03-16 LAB — URINALYSIS, COMPLETE (UACMP) WITH MICROSCOPIC
Bacteria, UA: NONE SEEN
Bilirubin Urine: NEGATIVE
Glucose, UA: NEGATIVE mg/dL
Ketones, ur: NEGATIVE mg/dL
Leukocytes,Ua: NEGATIVE
Nitrite: NEGATIVE
Protein, ur: NEGATIVE mg/dL
Specific Gravity, Urine: 1.003 — ABNORMAL LOW (ref 1.005–1.030)
Squamous Epithelial / HPF: NONE SEEN (ref 0–5)
pH: 6 (ref 5.0–8.0)

## 2020-03-16 LAB — BASIC METABOLIC PANEL
Anion gap: 9 (ref 5–15)
BUN: 9 mg/dL (ref 6–20)
CO2: 26 mmol/L (ref 22–32)
Calcium: 9.6 mg/dL (ref 8.9–10.3)
Chloride: 105 mmol/L (ref 98–111)
Creatinine, Ser: 0.73 mg/dL (ref 0.44–1.00)
GFR, Estimated: >60 mL/min (ref >60)
Glucose, Bld: 105 mg/dL — ABNORMAL HIGH (ref 70–99)
Potassium: 4.2 mmol/L (ref 3.5–5.1)
Sodium: 140 mmol/L (ref 135–145)

## 2020-03-16 MED ORDER — OXYCODONE-ACETAMINOPHEN 5-325 MG PO TABS
1.0000 | ORAL_TABLET | Freq: Once | ORAL | Status: AC
  Administered 2020-03-16: 1 via ORAL
  Filled 2020-03-16: qty 1

## 2020-03-16 MED ORDER — CYCLOBENZAPRINE HCL 5 MG PO TABS: 5.0000 mg | ORAL_TABLET | Freq: Three times a day (TID) | ORAL | 0 refills | Status: DC | PRN

## 2020-03-16 MED ORDER — KETOROLAC TROMETHAMINE 30 MG/ML IJ SOLN
15.0000 mg | Freq: Once | INTRAMUSCULAR | Status: AC
  Administered 2020-03-16: 15 mg via INTRAVENOUS
  Filled 2020-03-16: qty 1

## 2020-03-16 NOTE — Discharge Instructions (Addendum)

## 2020-03-16 NOTE — ED Provider Notes (Signed)
[facility] Emergency Department Provider Note    First MD Initiated Contact with Patient 03/16/20 [phone]     (approximate)  I have reviewed the triage vital signs and the nursing notes.   HISTORY  Chief Complaint Back Pain    HPI Jenna Flowers is a 49 y.o. female the below listed past medical history presents to the ER for low back pain with some intermittent radiation to her hips.  States that she does work 2 jobs and felt like she was wearing some bad shoes and causing muscle strain but this feels different.  States she also has a history of pancreatitis but this is also different from that.  Denies any fevers.  Pain comes and goes when it comes very severe.  Denies any numbness or tingling.    Past Medical History:  Diagnosis Date  . Hypertension    Family History  Problem Relation Age of Onset  . Breast cancer Neg Hx    Past Surgical History:  Procedure Laterality Date  . CHOLECYSTECTOMY     Patient Active Problem List   Diagnosis Date Noted  . OTHER ACUTE SINUSITIS 08/10/2009      Prior to Admission medications   Medication Sig Start Date End Date Taking? Authorizing Provider  cyclobenzaprine (FLEXERIL) 5 MG tablet Take 1 tablet (5 mg total) by mouth 3 (three) times daily as needed for muscle spasms. 03/16/20   Willy Eddy, MD  oxyCODONE-acetaminophen (PERCOCET) 5-325 MG tablet Take 2 tablets by mouth every 6 (six) hours as needed for moderate pain or severe pain. 05/03/15   Emily Filbert, MD  pantoprazole (PROTONIX) 40 MG tablet Take 1 tablet (40 mg total) by mouth daily. 05/03/15 05/02/16  Emily Filbert, MD    Allergies Amoxicillin, Codeine, Codeine sulfate, and Penicillins    Social History Social History   Tobacco Use  . Smoking status: Current Every Day Smoker    Packs/day: 1.00    Types: Cigarettes  . Smokeless tobacco: Never Used  Substance Use Topics  . Alcohol use: No  . Drug use: No    Review of  Systems Patient denies headaches, rhinorrhea, blurry vision, numbness, shortness of breath, chest pain, edema, cough, abdominal pain, nausea, vomiting, diarrhea, dysuria, fevers, rashes or hallucinations unless otherwise stated above in HPI. ____________________________________________   PHYSICAL EXAM:  VITAL SIGNS: Vitals:   03/16/20 0730 03/16/20 0800  BP: (!) 182/109 (!) 143/83  Pulse: 67 88  Resp: 15 16  Temp:    SpO2: 96% 98%    Constitutional: Alert and oriented.  Eyes: Conjunctivae are normal.  Head: Atraumatic. Nose: No congestion/rhinnorhea. Mouth/Throat: Mucous membranes are moist.   Neck: No stridor. Painless ROM.  Cardiovascular: Normal rate, regular rhythm. Grossly normal heart sounds.  Good peripheral circulation. Respiratory: Normal respiratory effort.  No retractions. Lungs CTAB. Gastrointestinal: Soft and nontender. No distention. No abdominal bruits. No CVA tenderness. Genitourinary:  Musculoskeletal: No lower extremity tenderness nor edema.  No joint effusions. Neurologic:  Normal speech and language. No gross focal neurologic deficits are appreciated. No facial droop Skin:  Skin is warm, dry and intact. No rash noted. Psychiatric: Mood and affect are normal. Speech and behavior are normal.  ____________________________________________   LABS (all labs ordered are listed, but only abnormal results are displayed)  Results for orders placed or performed during the hospital encounter of 03/16/20 (from the past 24 hour(s))  Urinalysis, Complete w Microscopic     Status: Abnormal   Collection Time: 03/16/20  3:39 AM  Result Value Ref Range   Color, Urine STRAW (A) YELLOW   APPearance CLEAR (A) CLEAR   Specific Gravity, Urine 1.003 (L) 1.005 - 1.030   pH 6.0 5.0 - 8.0   Glucose, UA NEGATIVE NEGATIVE mg/dL   Hgb urine dipstick SMALL (A) NEGATIVE   Bilirubin Urine NEGATIVE NEGATIVE   Ketones, ur NEGATIVE NEGATIVE mg/dL   Protein, ur NEGATIVE NEGATIVE mg/dL     Nitrite NEGATIVE NEGATIVE   Leukocytes,Ua NEGATIVE NEGATIVE   WBC, UA 0-5 0 - 5 WBC/hpf   Bacteria, UA NONE SEEN NONE SEEN   Squamous Epithelial / LPF NONE SEEN 0 - 5  CBC with Differential     Status: None   Collection Time: 03/16/20  7:44 AM  Result Value Ref Range   WBC 7.9 4.0 - 10.5 K/uL   RBC 4.52 3.87 - 5.11 MIL/uL   Hemoglobin 12.8 12.0 - 15.0 g/dL   HCT 78.2 36 - 46 %   MCV 87.4 80.0 - 100.0 fL   MCH 28.3 26.0 - 34.0 pg   MCHC 32.4 30.0 - 36.0 g/dL   RDW 95.6 21.3 - 08.6 %   Platelets 262 150 - 400 K/uL   nRBC 0.0 0.0 - 0.2 %   Neutrophils Relative % 62 %   Neutro Abs 4.8 1.7 - 7.7 K/uL   Lymphocytes Relative 31 %   Lymphs Abs 2.5 0.7 - 4.0 K/uL   Monocytes Relative 6 %   Monocytes Absolute 0.4 0.1 - 1.0 K/uL   Eosinophils Relative 1 %   Eosinophils Absolute 0.1 0.0 - 0.5 K/uL   Basophils Relative 0 %   Basophils Absolute 0.0 0.0 - 0.1 K/uL   Immature Granulocytes 0 %   Abs Immature Granulocytes 0.03 0.00 - 0.07 K/uL  Basic metabolic panel     Status: Abnormal   Collection Time: 03/16/20  7:44 AM  Result Value Ref Range   Sodium 140 135 - 145 mmol/L   Potassium 4.2 3.5 - 5.1 mmol/L   Chloride 105 98 - 111 mmol/L   CO2 26 22 - 32 mmol/L   Glucose, Bld 105 (H) 70 - 99 mg/dL   BUN 9 6 - 20 mg/dL   Creatinine, Ser 5.78 0.44 - 1.00 mg/dL   Calcium 9.6 8.9 - 46.9 mg/dL   GFR, Estimated >62 >95 mL/min   Anion gap 9 5 - 15   ____________________________________________ ____________________________________________  RADIOLOGY  I personally reviewed all radiographic images ordered to evaluate for the above acute complaints and reviewed radiology reports and findings.  These findings were personally discussed with the patient.  Please see medical record for radiology report.  ____________________________________________   PROCEDURES  Procedure(s) performed:  Procedures    Critical Care performed:  no ____________________________________________   INITIAL IMPRESSION / ASSESSMENT AND PLAN / ED COURSE  Pertinent labs & imaging results that were available during my care of the patient were reviewed by me and considered in my medical decision making (see chart for details).   DDX: pyelo, stone, msk strain, doubt AAA, appendicitis  Jenna Flowers is a 49 y.o. who presents to the ED with symptoms as described above.  She is afebrile hemodynamically stable.  Exam is reassuring.  No focal neuro deficits.  Abdominal exam is soft benign.  Urine does have some trace blood and she does have some features that are suggestive of kidney stone.  Given her description of pain will check blood work will order CT renal stone study  will give pain medication and reassess.  Clinical Course as of Mar 16 852  Caleen Essex Mar 16, 2020  0827 Blood work is reassuring.  On my review of CT imaging I will see any evidence of stone AAA or other acute intra-abdominal process.   [PR]  0848 Patient reassessed.  CT imaging confirms no evidence of acute intra-abdominal process.  Suspect muscle strain.  No other red flags identified she is otherwise well-appearing nontoxic.  This not consistent with cauda equina or spinal stenosis AAA or acute intra-abdominal process.  Have discussed with the patient and available family all diagnostics and treatments performed thus far and all questions were answered to the best of my ability. The patient demonstrates understanding and agreement with plan.    [PR]    Clinical Course User Index [PR] Willy Eddy, MD    The patient was evaluated in Emergency Department today for the symptoms described in the history of present illness. He/she was evaluated in the context of the global COVID-19 pandemic, which necessitated consideration that the patient might be at risk for infection with the SARS-CoV-2 virus that causes COVID-19. Institutional protocols and algorithms that pertain to the  evaluation of patients at risk for COVID-19 are in a state of rapid change based on information released by regulatory bodies including the CDC and federal and state organizations. These policies and algorithms were followed during the patient's care in the ED.  As part of my medical decision making, I reviewed the following data within the electronic MEDICAL RECORD NUMBER Nursing notes reviewed and incorporated, Labs reviewed, notes from prior ED visits and [place] Controlled Substance Database   ____________________________________________   FINAL CLINICAL IMPRESSION(S) / ED DIAGNOSES  Final diagnoses:  Acute midline low back pain without sciatica      NEW MEDICATIONS STARTED DURING THIS VISIT:  New Prescriptions   CYCLOBENZAPRINE (FLEXERIL) 5 MG TABLET    Take 1 tablet (5 mg total) by mouth 3 (three) times daily as needed for muscle spasms.     Note:  This document was prepared using Dragon voice recognition software and may include unintentional dictation errors.    Willy Eddy, MD 03/16/20 [phone]

## 2020-03-16 NOTE — ED Notes (Signed)
Took over care of pt. Pt resting comfortably and has no complaints at this time. Awaiting further orders. Will continue to monitor.

## 2020-03-16 NOTE — ED Triage Notes (Addendum)
Pt arrived via POV with reports of bilateral lower back pain that started after she woke up. Pt has not taken any medications for pain.  Pt denies any injury to back

## 2020-04-24 ENCOUNTER — Other Ambulatory Visit: Payer: Self-pay

## 2020-04-24 ENCOUNTER — Ambulatory Visit
Admission: EM | Admit: 2020-04-24 | Discharge: 2020-04-24 | Disposition: A | Discharge status: Home / Self Care | Payer: Managed Care, Other (non HMO) | Attending: Emergency Medicine | Admitting: Emergency Medicine

## 2020-04-24 DIAGNOSIS — R059 Cough, unspecified: Secondary | ICD-10-CM | POA: Diagnosis not present

## 2020-04-24 DIAGNOSIS — I10 Essential (primary) hypertension: Secondary | ICD-10-CM | POA: Diagnosis not present

## 2020-04-24 DIAGNOSIS — J069 Acute upper respiratory infection, unspecified: Secondary | ICD-10-CM | POA: Insufficient documentation

## 2020-04-24 DIAGNOSIS — Z20822 Contact with and (suspected) exposure to covid-19: Secondary | ICD-10-CM | POA: Diagnosis not present

## 2020-04-24 DIAGNOSIS — F17210 Nicotine dependence, cigarettes, uncomplicated: Secondary | ICD-10-CM | POA: Insufficient documentation

## 2020-04-24 DIAGNOSIS — Z7901 Long term (current) use of anticoagulants: Secondary | ICD-10-CM | POA: Diagnosis not present

## 2020-04-24 DIAGNOSIS — R0982 Postnasal drip: Secondary | ICD-10-CM | POA: Insufficient documentation

## 2020-04-24 DIAGNOSIS — H6502 Acute serous otitis media, left ear: Secondary | ICD-10-CM | POA: Diagnosis not present

## 2020-04-24 LAB — RESP PANEL BY RT-PCR (FLU A&B, COVID) ARPGX2
Influenza A by PCR: NEGATIVE
Influenza B by PCR: NEGATIVE
SARS Coronavirus 2 by RT PCR: NEGATIVE

## 2020-04-24 MED ORDER — IPRATROPIUM BROMIDE 0.06 % NA SOLN: 2.0000 | Freq: Four times a day (QID) | NASAL | 12 refills | Status: DC

## 2020-04-24 MED ORDER — PSEUDOEPH-BROMPHEN-DM 30-2-10 MG/5ML PO SYRP: 10.0000 mL | ORAL_SOLUTION | Freq: Four times a day (QID) | ORAL | 0 refills | Status: AC | PRN

## 2020-04-24 NOTE — ED Triage Notes (Signed)
Pt is here with nasal congestion and left ear fullness since Sunday, pt has taken Sudafed to relieve discomfort.

## 2020-04-24 NOTE — ED Provider Notes (Signed)
[facility]    CSN: [id number] Arrival date & time: 04/24/20  1841      History   Chief Complaint Chief Complaint  Patient presents with  . Nasal Congestion    HPI Jenna Flowers is a 49 y.o. female presenting for nasal congestion and left ear fullness x2 days.  Also admits to postnasal drainage, scratchy throat, and mild cough.  Patient has been taking Sudafed.  Patient says that she feels fluid in her left ear and that always turns into an ear infection so she wanted to get early this time.  Patient denies any fever, fatigue, body aches, weakness, chest discomfort, breathing difficulty, N/V/D, or changes in smell and taste. Patient denies known COVID-19 exposure and has been vaccinated for COVID-19.  Medical history significant for hypertension.  No other complaints or concerns.  HPI  Past Medical History:  Diagnosis Date  . Hypertension     Patient Active Problem List   Diagnosis Date Noted  . OTHER ACUTE SINUSITIS 08/10/2009    Past Surgical History:  Procedure Laterality Date  . CHOLECYSTECTOMY      OB History   No obstetric history on file.      Home Medications    Prior to Admission medications   Medication Sig Start Date End Date Taking? Authorizing Provider  brompheniramine-pseudoephedrine-DM 30-2-10 MG/5ML syrup Take 10 mLs by mouth 4 (four) times daily as needed for up to 7 days. 04/24/20 05/01/20 Yes Eusebio Friendly B, PA-C  ipratropium (ATROVENT) 0.06 % nasal spray Place 2 sprays into both nostrils 4 (four) times daily. 04/24/20  Yes Eusebio Friendly B, PA-C  cyclobenzaprine (FLEXERIL) 5 MG tablet Take 1 tablet (5 mg total) by mouth 3 (three) times daily as needed for muscle spasms. 03/16/20   Willy Eddy, MD  losartan (COZAAR) 25 MG tablet Take 25 mg by mouth daily. 03/20/20   [provider]  oxyCODONE-acetaminophen (PERCOCET) 5-325 MG tablet Take 2 tablets by mouth every 6 (six) hours as needed for moderate pain or severe  pain. 05/03/15   Emily Filbert, MD  pantoprazole (PROTONIX) 40 MG tablet Take 1 tablet (40 mg total) by mouth daily. 05/03/15 05/02/16  Emily Filbert, MD    Family History Family History  Problem Relation Age of Onset  . Diabetes Mother   . Diabetes Father   . Breast cancer Neg Hx     Social History Social History   Tobacco Use  . Smoking status: Current Every Day Smoker    Packs/day: 1.00    Types: Cigarettes  . Smokeless tobacco: Never Used  Substance Use Topics  . Alcohol use: No  . Drug use: No     Allergies   Amoxicillin, Codeine, Codeine sulfate, and Penicillins   Review of Systems Review of Systems  Constitutional: Negative for chills, diaphoresis, fatigue and fever.  HENT: Positive for congestion, postnasal drip and rhinorrhea. Negative for ear pain, sinus pressure, sinus pain and sore throat.   Respiratory: Positive for cough. Negative for shortness of breath.   Gastrointestinal: Negative for abdominal pain, nausea and vomiting.  Musculoskeletal: Negative for arthralgias and myalgias.  Skin: Negative for rash.  Neurological: Negative for weakness and headaches.  Hematological: Negative for adenopathy.     Physical Exam Triage Vital Signs ED Triage Vitals  Enc Vitals Group     BP 04/24/20 2006 (!) 144/82     Pulse Rate 04/24/20 2006 85     Resp 04/24/20 2006 18     Temp  04/24/20 2006 99.1 F (37.3 C)     Temp Source 04/24/20 2006 Oral     SpO2 04/24/20 2006 100 %     Weight --      Height --      Head Circumference --      Peak Flow --      Pain Score 04/24/20 2004 0     Pain Loc --      Pain Edu? --      Excl. in GC? --    No data found.  Updated Vital Signs BP (!) 144/82 (BP Location: Left Arm)   Pulse 85   Temp 99.1 F (37.3 C) (Oral)   Resp 18   SpO2 100%        Physical Exam Vitals and nursing note reviewed.  Constitutional:      General: She is not in acute distress.    Appearance: Normal appearance. She is not  ill-appearing or toxic-appearing.  HENT:     Head: Normocephalic and atraumatic.     Right Ear: Ear canal and external ear normal. A middle ear effusion is present.     Left Ear: Ear canal and external ear normal. A middle ear effusion is present.     Nose: Congestion and rhinorrhea present.     Mouth/Throat:     Mouth: Mucous membranes are moist.     Pharynx: Oropharynx is clear. Posterior oropharyngeal erythema (with clear PND) present.  Eyes:     General: No scleral icterus.       Right eye: No discharge.        Left eye: No discharge.     Conjunctiva/sclera: Conjunctivae normal.  Cardiovascular:     Rate and Rhythm: Normal rate and regular rhythm.     Heart sounds: Normal heart sounds.  Pulmonary:     Effort: Pulmonary effort is normal. No respiratory distress.     Breath sounds: Normal breath sounds.  Musculoskeletal:     Cervical back: Neck supple.  Skin:    General: Skin is dry.  Neurological:     General: No focal deficit present.     Mental Status: She is alert. Mental status is at baseline.     Motor: No weakness.     Gait: Gait normal.  Psychiatric:        Mood and Affect: Mood normal.        Behavior: Behavior normal.        Thought Content: Thought content normal.      UC Treatments / Results  Labs (all labs ordered are listed, but only abnormal results are displayed) Labs Reviewed  RESP PANEL BY RT-PCR (FLU A&B, COVID) [id number]    EKG   Radiology No results found.  Procedures Procedures (including critical care time)  Medications Ordered in UC Medications - No data to display  Initial Impression / Assessment and Plan / UC Course  I have reviewed the triage vital signs and the nursing notes.  Pertinent labs & imaging results that were available during my care of the patient were reviewed by me and considered in my medical decision making (see chart for details).   Exam consistent with serous otitis media.  No sign of bacterial infection.  Sx  consistent with viral URI. Advised patient that she should get better with decongestants I have sent Bromfed and also Atrovent nasal spray.  Encouraged increasing rest and fluids.  Advised patient we will call with results of her respiratory panel she tested positive  for Covid or flu.  Advised to follow-up with [place] as needed.  She denies any ear pain at this point.  I advised her to follow-up she develops a fever or ear pain or any worsening symptoms.  Patient agreeable.  All negative respiratory panel.  Final Clinical Impressions(s) / UC Diagnoses   Final diagnoses:  Viral upper respiratory tract infection  Acute serous otitis media of left ear, recurrence not specified     Discharge Instructions     URI/COLD SYMPTOMS: Your exam today is consistent with a viral illness. Antibiotics are not indicated at this time. Use medications as directed, including cough syrup, nasal saline, and decongestants. Your symptoms should improve over the next few days and resolve within 7-10 days. Increase rest and fluids. F/u if symptoms worsen or predominate such as sore throat, ear pain, productive cough, shortness of breath, or if you develop high fevers or worsening fatigue over the next several days.    You have received COVID testing today either for positive exposure, concerning symptoms that could be related to COVID infection, screening purposes, or re-testing after confirmed positive.  Your test obtained today checks for active viral infection in the last 1-2 weeks. If your test is negative now, you can still test positive later. So, if you do develop symptoms you should either get re-tested and/or isolate x 10 days. Please follow CDC guidelines.  While Rapid antigen tests come back in 15-20 minutes, send out PCR/molecular test results typically come back within 24 hours. In the mean time, if you are symptomatic, assume this could be a positive test and treat/monitor yourself as if you do have COVID.   We  will call with test results. Please download the MyChart app and set up a profile to access test results.   If symptomatic, go home and rest. Push fluids. Take Tylenol as needed for discomfort. Gargle warm salt water. Throat lozenges. Take Mucinex DM or Robitussin for cough. Humidifier in bedroom to ease coughing. Warm showers. Also review the COVID handout for more information.  COVID-19 INFECTION: The incubation period of COVID-19 is approximately 14 days after exposure, with most symptoms developing in roughly 4-5 days. Symptoms may range in severity from mild to critically severe. Roughly 80% of those infected will have mild symptoms. People of any age may become infected with COVID-19 and have the ability to transmit the virus. The most common symptoms include: fever, fatigue, cough, body aches, headaches, sore throat, nasal congestion, shortness of breath, nausea, vomiting, diarrhea, changes in smell and/or taste.    COURSE OF ILLNESS Some patients may begin with mild disease which can progress quickly into critical symptoms. If your symptoms are worsening please call ahead to the Emergency Department and proceed there for further treatment. Recovery time appears to be roughly 1-2 weeks for mild symptoms and 3-6 weeks for severe disease.   GO IMMEDIATELY TO ER FOR FEVER YOU ARE UNABLE TO GET DOWN WITH TYLENOL, BREATHING PROBLEMS, CHEST PAIN, FATIGUE, LETHARGY, INABILITY TO EAT OR DRINK, ETC  QUARANTINE AND ISOLATION: To help decrease the spread of COVID-19 please remain isolated if you have COVID infection or are highly suspected to have COVID infection. This means -stay home and isolate to one room in the home if you live with others. Do not share a bed or bathroom with others while ill, sanitize and wipe down all countertops and keep common areas clean and disinfected. You may discontinue isolation if you have a mild case and are asymptomatic 10  days after symptom onset as long as you have been  fever free >24 hours without having to take Motrin or Tylenol. If your case is more severe (meaning you develop pneumonia or are admitted in the hospital), you may have to isolate longer.   If you have been in close contact (within 6 feet) of someone diagnosed with COVID 19, you are advised to quarantine in your home for 14 days as symptoms can develop anywhere from 2-14 days after exposure to the virus. If you develop symptoms, you  must isolate.  Most current guidelines for COVID after exposure -isolate 10 days if you ARE NOT tested for COVID as long as symptoms do not develop -isolate 7 days if you are tested and remain asymptomatic -You do not necessarily need to be tested for COVID if you have + exposure and        develop   symptoms. Just isolate at home x10 days from symptom onset During this global pandemic, CDC advises to practice social distancing, try to stay at least 676ft away from others at all times. Wear a face covering. Wash and sanitize your hands regularly and avoid going anywhere that is not necessary.  KEEP IN MIND THAT THE COVID TEST IS NOT 100% ACCURATE AND YOU SHOULD STILL DO EVERYTHING TO PREVENT POTENTIAL SPREAD OF VIRUS TO OTHERS (WEAR MASK, WEAR GLOVES, WASH HANDS AND SANITIZE REGULARLY). IF INITIAL TEST IS NEGATIVE, THIS MAY NOT MEAN YOU ARE DEFINITELY NEGATIVE. MOST ACCURATE TESTING IS DONE 5-7 DAYS AFTER EXPOSURE.   It is not advised by CDC to get re-tested after receiving a positive COVID test since you can still test positive for weeks to months after you have already cleared the virus.   *If you have not been vaccinated for COVID, I strongly suggest you consider getting vaccinated as long as there are no contraindications.      ED Prescriptions    Medication Sig Dispense Auth. Provider   ipratropium (ATROVENT) 0.06 % nasal spray Place 2 sprays into both nostrils 4 (four) times daily. 15 mL Eusebio FriendlyEaves, Temesha Queener B, PA-C   brompheniramine-pseudoephedrine-DM 30-2-10 MG/5ML  syrup Take 10 mLs by mouth 4 (four) times daily as needed for up to 7 days. 150 mL Shirlee LatchEaves, Layth Cerezo B, PA-C     PDMP not reviewed this encounter.   Shirlee Latchaves, Blaize Nipper B, PA-C 04/24/20 2121

## 2020-04-24 NOTE — Discharge Instructions (Signed)

## 2020-07-23 ENCOUNTER — Other Ambulatory Visit: Payer: Self-pay | Admitting: Internal Medicine

## 2020-07-23 DIAGNOSIS — Z1231 Encounter for screening mammogram for malignant neoplasm of breast: Secondary | ICD-10-CM

## 2020-08-10 ENCOUNTER — Ambulatory Visit
Admission: RE | Admit: 2020-08-10 | Discharge: 2020-08-10 | Disposition: A | Discharge status: Home / Self Care | Payer: Managed Care, Other (non HMO) | Source: Ambulatory Visit | Attending: Internal Medicine | Admitting: Internal Medicine

## 2020-08-10 ENCOUNTER — Other Ambulatory Visit: Payer: Self-pay

## 2020-08-10 DIAGNOSIS — Z1231 Encounter for screening mammogram for malignant neoplasm of breast: Secondary | ICD-10-CM | POA: Insufficient documentation

## 2020-08-20 ENCOUNTER — Other Ambulatory Visit: Payer: Self-pay | Admitting: Internal Medicine

## 2020-08-20 DIAGNOSIS — R928 Other abnormal and inconclusive findings on diagnostic imaging of breast: Secondary | ICD-10-CM

## 2020-08-20 DIAGNOSIS — N6320 Unspecified lump in the left breast, unspecified quadrant: Secondary | ICD-10-CM

## 2020-09-03 ENCOUNTER — Ambulatory Visit
Admission: RE | Admit: 2020-09-03 | Discharge: 2020-09-03 | Disposition: A | Discharge status: Home / Self Care | Payer: Managed Care, Other (non HMO) | Source: Ambulatory Visit | Attending: Internal Medicine | Admitting: Internal Medicine

## 2020-09-03 ENCOUNTER — Other Ambulatory Visit: Payer: Self-pay

## 2020-09-03 DIAGNOSIS — N6320 Unspecified lump in the left breast, unspecified quadrant: Secondary | ICD-10-CM | POA: Insufficient documentation

## 2020-09-03 DIAGNOSIS — R928 Other abnormal and inconclusive findings on diagnostic imaging of breast: Secondary | ICD-10-CM | POA: Diagnosis not present

## 2021-07-11 ENCOUNTER — Other Ambulatory Visit: Payer: Self-pay | Admitting: Internal Medicine

## 2021-08-26 ENCOUNTER — Encounter: Payer: Self-pay | Admitting: Radiology

## 2021-08-26 ENCOUNTER — Ambulatory Visit
Admission: RE | Admit: 2021-08-26 | Discharge: 2021-08-26 | Disposition: A | Discharge status: Home / Self Care | Payer: Managed Care, Other (non HMO) | Source: Ambulatory Visit | Attending: Internal Medicine | Admitting: Internal Medicine

## 2021-08-26 DIAGNOSIS — Z1231 Encounter for screening mammogram for malignant neoplasm of breast: Secondary | ICD-10-CM | POA: Diagnosis not present

## 2021-08-27 ENCOUNTER — Other Ambulatory Visit: Payer: Self-pay | Admitting: Internal Medicine

## 2021-08-27 DIAGNOSIS — R928 Other abnormal and inconclusive findings on diagnostic imaging of breast: Secondary | ICD-10-CM

## 2021-08-27 DIAGNOSIS — N6489 Other specified disorders of breast: Secondary | ICD-10-CM

## 2021-09-26 ENCOUNTER — Ambulatory Visit
Admission: RE | Admit: 2021-09-26 | Discharge: 2021-09-26 | Disposition: A | Discharge status: Home / Self Care | Payer: Managed Care, Other (non HMO) | Source: Ambulatory Visit | Attending: Internal Medicine | Admitting: Internal Medicine

## 2021-09-26 DIAGNOSIS — N6489 Other specified disorders of breast: Secondary | ICD-10-CM | POA: Diagnosis present

## 2021-09-26 DIAGNOSIS — R928 Other abnormal and inconclusive findings on diagnostic imaging of breast: Secondary | ICD-10-CM | POA: Diagnosis not present

## 2021-12-04 DIAGNOSIS — M179 Osteoarthritis of knee, unspecified: Secondary | ICD-10-CM | POA: Insufficient documentation

## 2022-05-12 LAB — HM PAP SMEAR: HM Pap smear: NEGATIVE

## 2022-08-02 IMAGING — MG MM DIGITAL DIAGNOSTIC UNILAT*R* W/ TOMO W/ CAD
6 series · 6 of 18 positions shown · non-contrast
Comparison: Previous exam(s).

CLINICAL DATA: Callback for RIGHT breast retroareolar asymmetry
seen on CC view only

EXAM:
DIGITAL DIAGNOSTIC UNILATERAL RIGHT MAMMOGRAM WITH TOMOSYNTHESIS AND
CAD; ULTRASOUND RIGHT BREAST LIMITED
TECHNIQUE: Right digital diagnostic mammography and breast tomosynthesis was
performed. The images were evaluated with computer-aided detection.;
Targeted ultrasound examination of the right breast was performed

[R CC synth-2D (1 of 2)]
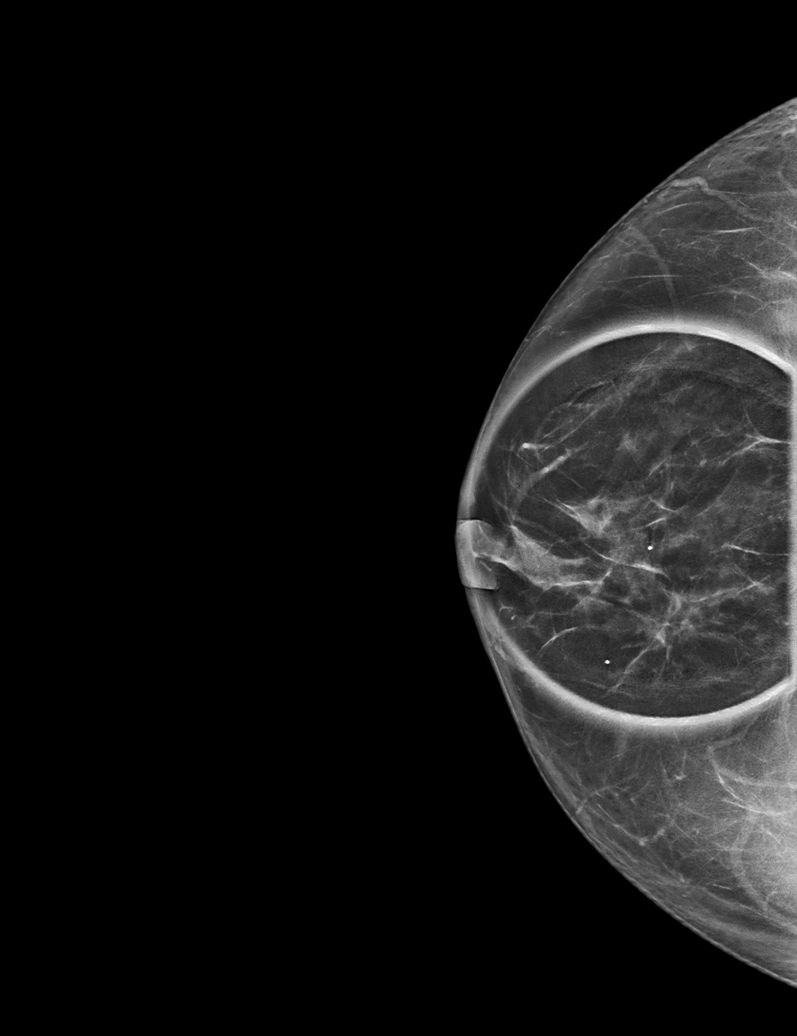

[R ML synth-2D]
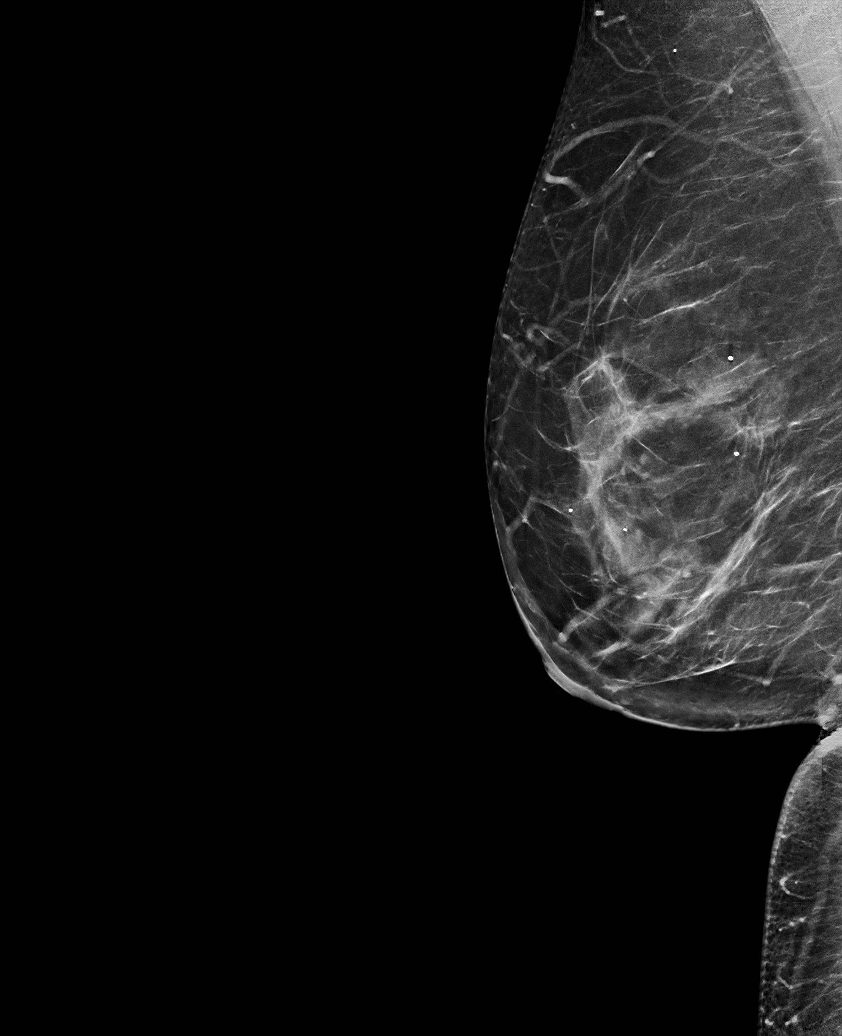

[R CC synth-2D (2 of 2)]
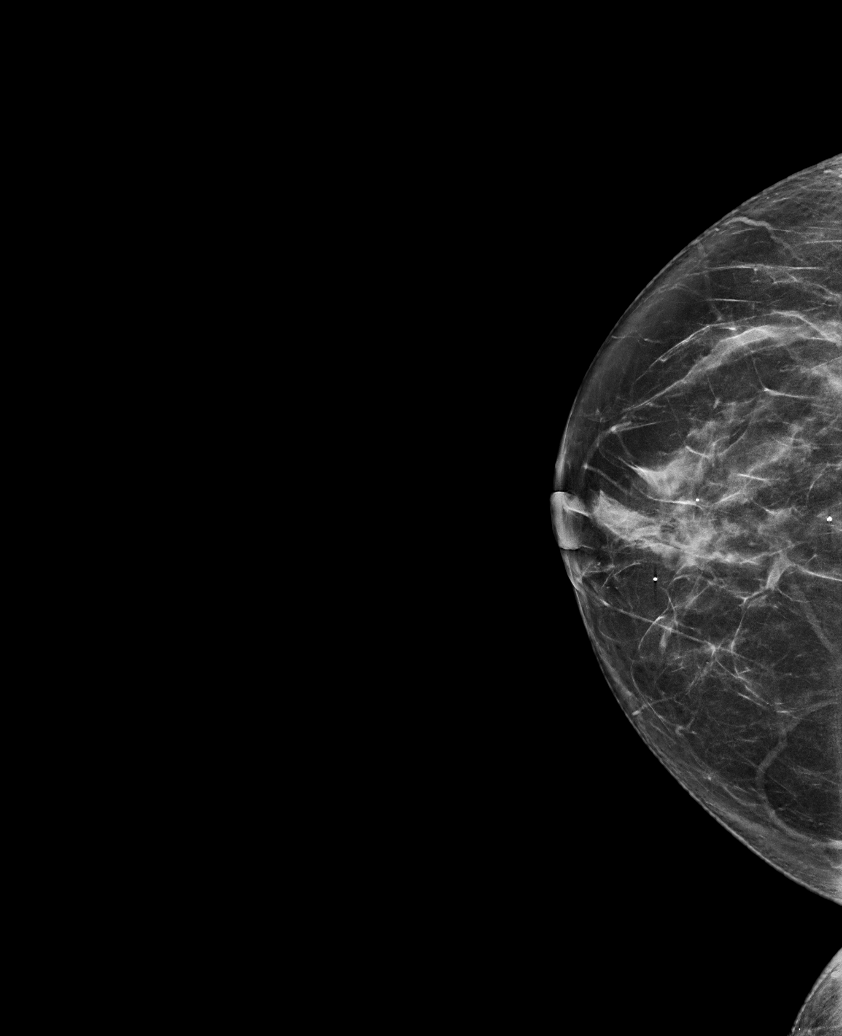

[R CC tomo (1 of 2) · tomo slice 33/64.0]
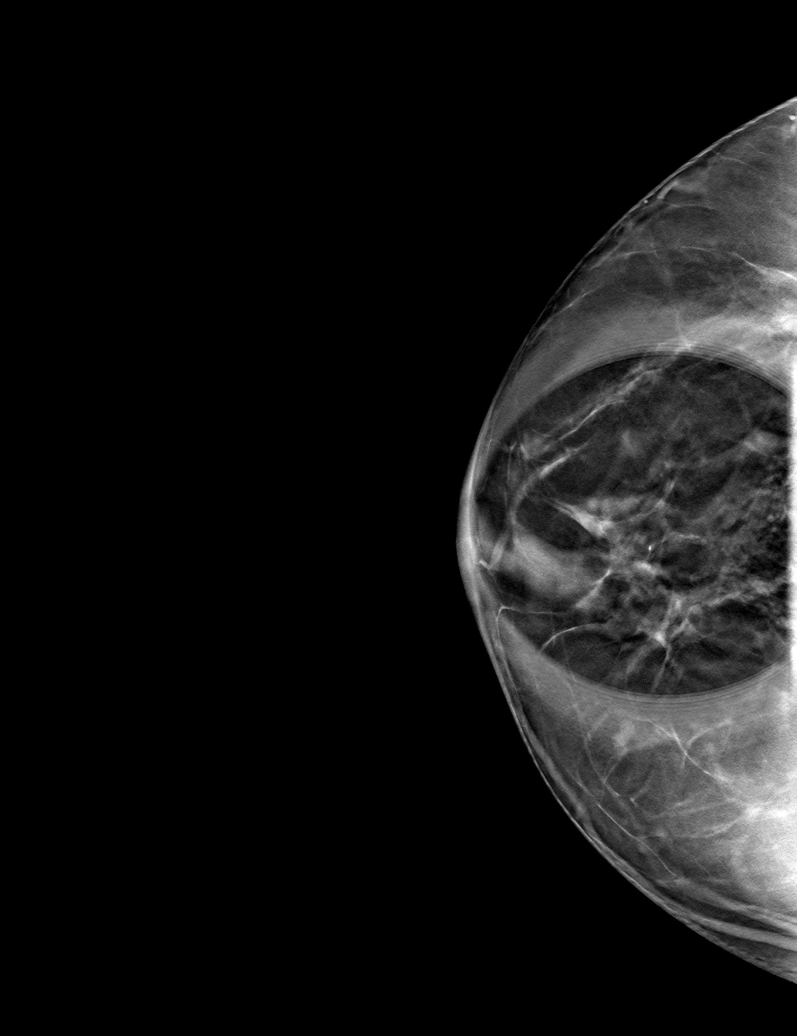

[R ML tomo · tomo slice 42/83.0]
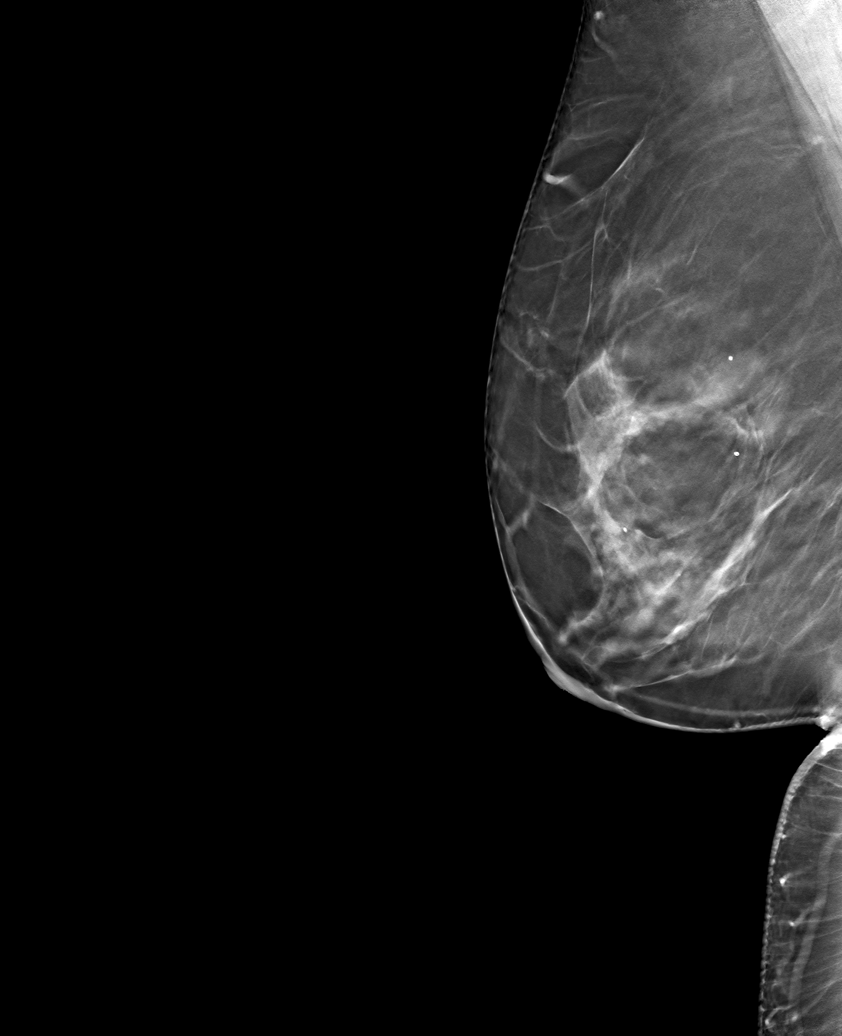

[R CC tomo (2 of 2) · tomo slice 37/73.0]
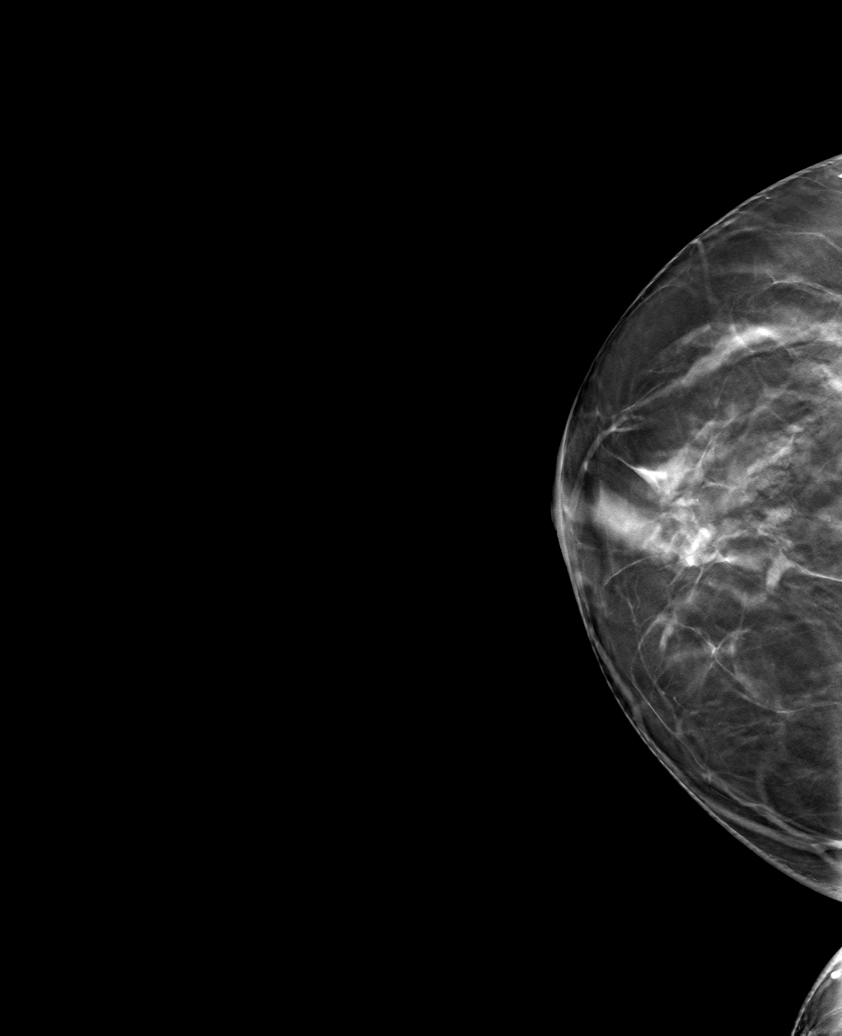

[6 of 18 positions shown; findings below may reference images not displayed]

ACR Breast Density Category b: There are scattered areas of
fibroglandular density.
FINDINGS: The previously described finding does not persist with additional
views, consistent with superimposed fibroglandular tissue.
Fibroglandular tissue assumes a configuration stable in comparison
to remote prior mammograms. No suspicious mass, microcalcification,
or other finding is identified. Due to retroareolar location,
targeted ultrasound was performed.

On physical exam, no suspicious mass is appreciated.

Targeted RIGHT retroareolar ultrasound was performed. No suspicious
cystic or solid mass is seen.
IMPRESSION: No mammographic or sonographic evidence of malignancy at the site of
screening mammographic concern.

RECOMMENDATION:
Screening mammogram in one year.(Code:JO-B-S24)

I have discussed the findings and recommendations with the patient.
If applicable, a reminder letter will be sent to the patient
regarding the next appointment.

BI-RADS CATEGORY  1: Negative.

## 2022-10-03 ENCOUNTER — Other Ambulatory Visit: Payer: Self-pay | Admitting: Internal Medicine

## 2022-10-03 DIAGNOSIS — Z1231 Encounter for screening mammogram for malignant neoplasm of breast: Secondary | ICD-10-CM

## 2022-10-15 ENCOUNTER — Ambulatory Visit
Admission: RE | Admit: 2022-10-15 | Discharge: 2022-10-15 | Disposition: A | Discharge status: Home / Self Care | Payer: Managed Care, Other (non HMO) | Source: Ambulatory Visit | Attending: Internal Medicine | Admitting: Internal Medicine

## 2022-10-15 DIAGNOSIS — Z1231 Encounter for screening mammogram for malignant neoplasm of breast: Secondary | ICD-10-CM | POA: Insufficient documentation

## 2023-11-18 ENCOUNTER — Other Ambulatory Visit: Payer: Self-pay | Admitting: Internal Medicine

## 2023-11-18 DIAGNOSIS — Z1231 Encounter for screening mammogram for malignant neoplasm of breast: Secondary | ICD-10-CM

## 2023-12-03 ENCOUNTER — Ambulatory Visit
Admission: RE | Admit: 2023-12-03 | Discharge: 2023-12-03 | Disposition: A | Discharge status: Home / Self Care | Source: Ambulatory Visit | Attending: Internal Medicine | Admitting: Internal Medicine

## 2023-12-03 DIAGNOSIS — Z1231 Encounter for screening mammogram for malignant neoplasm of breast: Secondary | ICD-10-CM | POA: Diagnosis present

## 2023-12-03 LAB — HM MAMMOGRAPHY: HM Mammogram: NORMAL (ref 0–4)

## 2023-12-14 ENCOUNTER — Emergency Department

## 2023-12-14 ENCOUNTER — Other Ambulatory Visit: Payer: Self-pay

## 2023-12-14 ENCOUNTER — Observation Stay
Admission: EM | Admit: 2023-12-14 | Discharge: 2023-12-15 | Disposition: A | Discharge status: Home / Self Care | Attending: Internal Medicine | Admitting: Internal Medicine

## 2023-12-14 DIAGNOSIS — G43109 Migraine with aura, not intractable, without status migrainosus: Secondary | ICD-10-CM | POA: Diagnosis not present

## 2023-12-14 DIAGNOSIS — I10 Essential (primary) hypertension: Secondary | ICD-10-CM | POA: Diagnosis not present

## 2023-12-14 DIAGNOSIS — I639 Cerebral infarction, unspecified: Secondary | ICD-10-CM | POA: Diagnosis not present

## 2023-12-14 DIAGNOSIS — F17210 Nicotine dependence, cigarettes, uncomplicated: Secondary | ICD-10-CM | POA: Diagnosis not present

## 2023-12-14 DIAGNOSIS — E785 Hyperlipidemia, unspecified: Secondary | ICD-10-CM | POA: Diagnosis present

## 2023-12-14 DIAGNOSIS — Z7982 Long term (current) use of aspirin: Secondary | ICD-10-CM | POA: Insufficient documentation

## 2023-12-14 DIAGNOSIS — I6381 Other cerebral infarction due to occlusion or stenosis of small artery: Secondary | ICD-10-CM | POA: Diagnosis not present

## 2023-12-14 DIAGNOSIS — E66812 Obesity, class 2: Secondary | ICD-10-CM | POA: Diagnosis not present

## 2023-12-14 DIAGNOSIS — I6389 Other cerebral infarction: Secondary | ICD-10-CM | POA: Diagnosis not present

## 2023-12-14 DIAGNOSIS — R202 Paresthesia of skin: Secondary | ICD-10-CM | POA: Diagnosis present

## 2023-12-14 DIAGNOSIS — F17200 Nicotine dependence, unspecified, uncomplicated: Secondary | ICD-10-CM | POA: Diagnosis present

## 2023-12-14 DIAGNOSIS — Z79899 Other long term (current) drug therapy: Secondary | ICD-10-CM | POA: Diagnosis not present

## 2023-12-14 DIAGNOSIS — R29700 NIHSS score 0: Secondary | ICD-10-CM | POA: Diagnosis not present

## 2023-12-14 DIAGNOSIS — I160 Hypertensive urgency: Secondary | ICD-10-CM

## 2023-12-14 DIAGNOSIS — Z880 Allergy status to penicillin: Secondary | ICD-10-CM | POA: Diagnosis not present

## 2023-12-14 DIAGNOSIS — R200 Anesthesia of skin: Secondary | ICD-10-CM

## 2023-12-14 DIAGNOSIS — E669 Obesity, unspecified: Secondary | ICD-10-CM | POA: Diagnosis present

## 2023-12-14 HISTORY — DX: Obesity, unspecified: E66.9

## 2023-12-14 HISTORY — DX: Hyperlipidemia, unspecified: E78.5

## 2023-12-14 HISTORY — DX: Nicotine dependence, unspecified, uncomplicated: F17.200

## 2023-12-14 LAB — DIFFERENTIAL
Abs Immature Granulocytes: 0.02 K/uL (ref 0.00–0.07)
Basophils Absolute: 0 K/uL (ref 0.0–0.1)
Basophils Relative: 0 %
Eosinophils Absolute: 0.1 K/uL (ref 0.0–0.5)
Eosinophils Relative: 1 %
Immature Granulocytes: 0 %
Lymphocytes Relative: 43 %
Lymphs Abs: 3.3 K/uL (ref 0.7–4.0)
Monocytes Absolute: 0.4 K/uL (ref 0.1–1.0)
Monocytes Relative: 5 %
Neutro Abs: 3.9 K/uL (ref 1.7–7.7)
Neutrophils Relative %: 51 %

## 2023-12-14 LAB — CBC
HCT: 39.4 % (ref 36.0–46.0)
Hemoglobin: 13.2 g/dL (ref 12.0–15.0)
MCH: 28.9 pg (ref 26.0–34.0)
MCHC: 33.5 g/dL (ref 30.0–36.0)
MCV: 86.4 fL (ref 80.0–100.0)
Platelets: 245 K/uL (ref 150–400)
RBC: 4.56 MIL/uL (ref 3.87–5.11)
RDW: 13.6 % (ref 11.5–15.5)
WBC: 7.7 K/uL (ref 4.0–10.5)
nRBC: 0 % (ref 0.0–0.2)

## 2023-12-14 LAB — COMPREHENSIVE METABOLIC PANEL WITH GFR
ALT: 13 U/L (ref 0–44)
AST: 16 U/L (ref 15–41)
Albumin: 4.3 g/dL (ref 3.5–5.0)
Alkaline Phosphatase: 92 U/L (ref 38–126)
Anion gap: 10 (ref 5–15)
BUN: 8 mg/dL (ref 6–20)
CO2: 24 mmol/L (ref 22–32)
Calcium: 9.6 mg/dL (ref 8.9–10.3)
Chloride: 101 mmol/L (ref 98–111)
Creatinine, Ser: 0.74 mg/dL (ref 0.44–1.00)
GFR, Estimated: >60 mL/min (ref >60)
Glucose, Bld: 114 mg/dL — ABNORMAL HIGH (ref 70–99)
Potassium: 3.8 mmol/L (ref 3.5–5.1)
Sodium: 135 mmol/L (ref 135–145)
Total Bilirubin: 0.7 mg/dL (ref 0.0–1.2)
Total Protein: 7.9 g/dL (ref 6.5–8.1)

## 2023-12-14 LAB — APTT: aPTT: 32 s (ref 24–36)

## 2023-12-14 LAB — PROTIME-INR
INR: 1 (ref 0.8–1.2)
Prothrombin Time: 13.4 s (ref 11.4–15.2)

## 2023-12-14 LAB — ETHANOL: Alcohol, Ethyl (B): <15 mg/dL (ref <15)

## 2023-12-14 LAB — CBG MONITORING, ED: Glucose-Capillary: 123 mg/dL — ABNORMAL HIGH (ref 70–99)

## 2023-12-14 MED ORDER — ROSUVASTATIN CALCIUM 20 MG PO TABS
20.0000 mg | ORAL_TABLET | Freq: Every day | ORAL | Status: DC
  Administered 2023-12-15: 20 mg via ORAL
  Filled 2023-12-14: qty 1

## 2023-12-14 MED ORDER — NICOTINE 21 MG/24HR TD PT24
21.0000 mg | MEDICATED_PATCH | Freq: Every day | TRANSDERMAL | Status: DC
  Administered 2023-12-15: 21 mg via TRANSDERMAL
  Filled 2023-12-14: qty 1

## 2023-12-14 MED ORDER — ASPIRIN 300 MG RE SUPP
300.0000 mg | Freq: Every day | RECTAL | Status: DC
  Filled 2023-12-14: qty 1

## 2023-12-14 MED ORDER — DIPHENHYDRAMINE HCL 50 MG/ML IJ SOLN
25.0000 mg | Freq: Once | INTRAMUSCULAR | Status: AC
  Administered 2023-12-14: 25 mg via INTRAVENOUS
  Filled 2023-12-14: qty 1

## 2023-12-14 MED ORDER — ASPIRIN 325 MG PO TABS
325.0000 mg | ORAL_TABLET | Freq: Every day | ORAL | Status: DC
  Administered 2023-12-15: 325 mg via ORAL
  Filled 2023-12-14: qty 1

## 2023-12-14 MED ORDER — ENOXAPARIN SODIUM 40 MG/0.4ML IJ SOSY
40.0000 mg | PREFILLED_SYRINGE | INTRAMUSCULAR | Status: DC
  Administered 2023-12-15: 40 mg via SUBCUTANEOUS
  Filled 2023-12-14: qty 0.4

## 2023-12-14 MED ORDER — KETOROLAC TROMETHAMINE 15 MG/ML IJ SOLN
15.0000 mg | Freq: Once | INTRAMUSCULAR | Status: AC
  Administered 2023-12-14: 15 mg via INTRAVENOUS
  Filled 2023-12-14: qty 1

## 2023-12-14 MED ORDER — ENOXAPARIN SODIUM 40 MG/0.4ML IJ SOSY: 40.0000 mg | PREFILLED_SYRINGE | INTRAMUSCULAR | Status: DC

## 2023-12-14 MED ORDER — SENNOSIDES-DOCUSATE SODIUM 8.6-50 MG PO TABS: 1.0000 | ORAL_TABLET | Freq: Every evening | ORAL | Status: DC | PRN

## 2023-12-14 MED ORDER — ACETAMINOPHEN 160 MG/5ML PO SOLN: 650.0000 mg | ORAL | Status: DC | PRN

## 2023-12-14 MED ORDER — ONDANSETRON HCL 4 MG/2ML IJ SOLN: 4.0000 mg | Freq: Three times a day (TID) | INTRAMUSCULAR | Status: DC | PRN

## 2023-12-14 MED ORDER — SODIUM CHLORIDE 0.9% FLUSH
3.0000 mL | Freq: Once | INTRAVENOUS | Status: AC
  Administered 2023-12-14: 3 mL via INTRAVENOUS

## 2023-12-14 MED ORDER — HYDRALAZINE HCL 20 MG/ML IJ SOLN: 5.0000 mg | INTRAMUSCULAR | Status: DC | PRN

## 2023-12-14 MED ORDER — ACETAMINOPHEN 325 MG PO TABS: 650.0000 mg | ORAL_TABLET | ORAL | Status: DC | PRN

## 2023-12-14 MED ORDER — SODIUM CHLORIDE 0.9 % IV SOLN: INTRAVENOUS | Status: DC

## 2023-12-14 MED ORDER — ACETAMINOPHEN 650 MG RE SUPP: 650.0000 mg | RECTAL | Status: DC | PRN

## 2023-12-14 MED ORDER — METOCLOPRAMIDE HCL 5 MG/ML IJ SOLN
10.0000 mg | Freq: Once | INTRAMUSCULAR | Status: AC
  Administered 2023-12-14: 10 mg via INTRAVENOUS
  Filled 2023-12-14: qty 2

## 2023-12-14 MED ORDER — STROKE: EARLY STAGES OF RECOVERY BOOK: Freq: Once | Status: AC

## 2023-12-14 MED ORDER — IOHEXOL 350 MG/ML SOLN
75.0000 mL | Freq: Once | INTRAVENOUS | Status: AC | PRN
  Administered 2023-12-14: 75 mL via INTRAVENOUS

## 2023-12-14 NOTE — ED Notes (Signed)
 MD Nicholaus notified of patient's elevated BP. No new orders at this time.

## 2023-12-14 NOTE — ED Provider Notes (Signed)
 [facility] Provider Note    Event Date/Time   First MD Initiated Contact with Patient 12/14/23 1806     (approximate)   History   Code Stroke   HPI  Jenna Flowers is a 53 y.o. female with a past medical history of hypertension hyperlipidemia, tobacco use who presents to the emergency department with sudden onset right-sided numbness and weakness.  Patient states that she was at work when she started noticing some numbness on the right side of face that included the teeth and went down her right arm and leg.  She thought that she may have some right lower extremity weakness.  She stated that she may have had some balance issues.  She was in her normal state of health previous to this.  She did take a new supplement today and wonders whether this could be contributing to the symptoms  Patient states that she thinks her symptoms are resolving.  No longer has numbness in her face and reports that the numbness in her limbs are resolving as well      Physical Exam   Triage Vital Signs: ED Triage Vitals [12/14/23 1815]  Encounter Vitals Group     BP      Girls Systolic BP Percentile      Girls Diastolic BP Percentile      Boys Systolic BP Percentile      Boys Diastolic BP Percentile      Pulse      Resp      Temp      Temp src      SpO2      Weight      Height      Head Circumference      Peak Flow      Pain Score 0     Pain Loc      Pain Education      Exclude from Growth Chart     Most recent vital signs: Vitals:   12/14/23 2200 12/14/23 2315  BP: (!) 165/91   Pulse: (!) 58   Resp: 13   Temp:  98 F (36.7 C)  SpO2: 100%     Nursing Triage Note reviewed. Vital signs reviewed and patients oxygen saturation is normoxic  General: Patient is well nourished, well developed, awake and alert, resting comfortably in no acute distress Head: Normocephalic and atraumatic Eyes: Normal inspection, extraocular muscles intact, no conjunctival  pallor Ear, nose, throat: Normal external exam Neck: Normal range of motion Respiratory: Patient is in no respiratory distress, lungs CTAB Cardiovascular: Patient is not tachycardic, RRR without murmur appreciated GI: Abd SNT with no guarding or rebound  Back: Normal inspection of the back with good strength and range of motion throughout all ext Extremities: pulses intact with good cap refills, no LE pitting edema or calf tenderness Neuro: The patient is alert and oriented to person, place, and time, appropriately conversive, with 5/5 bilat UE/LE strength, no gross motor Coordination appears to be adequate. Skin: Warm, dry, and intact Psych: normal mood and affect, no SI or HI  ED Results / Procedures / Treatments   Labs (all labs ordered are listed, but only abnormal results are displayed) Labs Reviewed  COMPREHENSIVE METABOLIC PANEL WITH GFR - Abnormal; Notable for the following components:      Result Value   Glucose, Bld 114 (*)    All other components within normal limits  CBG MONITORING, ED - Abnormal; Notable for the following components:  Glucose-Capillary 123 (*)    All other components within normal limits  PROTIME-INR  APTT  CBC  DIFFERENTIAL  ETHANOL     EKG EKG and rhythm strip are interpreted by myself:   EKG: [Normal sinus rhythm] at heart rate of 75, normal QRS duration, QTc 75, nonspecific ST segments and T waves no ectopy EKG not consistent with Acute STEMI Rhythm strip: NSR in lead II   RADIOLOGY CT head: No intracranial hemorrhage on my independent review interpretation and radiologist agrees MRI brain:  Small acute infarct in the posterior limb of the left internal  capsule.      PROCEDURES:  Critical Care performed: Yes, see critical care procedure note(s)  .Critical Care  Performed by: Nicholaus Rolland BRAVO, MD Authorized by: Nicholaus Rolland BRAVO, MD   Critical care provider statement:    Critical care time (minutes):  30   Critical care was  necessary to treat or prevent imminent or life-threatening deterioration of the following conditions:  CNS failure or compromise   Critical care was time spent personally by me on the following activities:  Development of treatment plan with patient or surrogate, discussions with consultants, evaluation of patient's response to treatment, examination of patient, ordering and review of laboratory studies, ordering and review of radiographic studies, ordering and performing treatments and interventions, pulse oximetry, re-evaluation of patient's condition and review of old charts   Care discussed with: admitting provider   Comments:     Evaluation for code stroke and decision on not to give TNK    MEDICATIONS ORDERED IN ED: Medications  sodium chloride  flush (NS) 0.9 % injection 3 mL (3 mLs Intravenous Given 12/14/23 1825)  ketorolac  (TORADOL ) 15 MG/ML injection 15 mg (15 mg Intravenous Given 12/14/23 1857)  metoCLOPramide  (REGLAN ) injection 10 mg (10 mg Intravenous Given 12/14/23 1858)  diphenhydrAMINE  (BENADRYL ) injection 25 mg (25 mg Intravenous Given 12/14/23 1900)  iohexol  (OMNIPAQUE ) 350 MG/ML injection 75 mL (75 mLs Intravenous Contrast Given 12/14/23 2319)     IMPRESSION / MDM / ASSESSMENT AND PLAN / ED COURSE                                Differential diagnosis includes, but is not limited to, acute CVA, intracranial hemorrhage, multiple sclerosis, brain metastases, hypertensive urgency, atypical migraine  ED course: Patient arrives acutely as a code stroke.  I met the patient immediately and verified that glucose was greater than 90.  Patient on arrival had NIH stroke score of 0.  CT head demonstrated no intracranial hemorrhage.  Patient reported symptoms resolved.  Stroke neurologist did not think TNK was indicated given lack of deficits/resolved symptoms.  Permissive hypertension pursued.  MRI brain did demonstrate a very small subacute CVA.  I have ordered the CT angio head and neck  we discussed admission with the hospitalist.  Patient updated and amenable now to admission   Clinical Course as of 12/14/23 2321  Mon Dec 14, 2023  1840 Using epic chat, I discussed the case with neurologist.  Given patient has a NIH stroke score of 0 and resolving symptoms no indication for TNK at this time.  Plan for permissive hypertension until stat MRI completed. [HD]  2132 Unfortunately due to the surge of patient's, patient has been unable to get her stat MRI brain ordered by neurology.  Patient is thinking about leaving AGAINST MEDICAL ADVICE.  But the patient's family/daughter is at bedside.  I reviewed  the indications benefits risks and alternatives of obtaining the MRI including the possibility that this was a small stroke the need for further neurological assessment and other testing, the possibility of a larger stroke in the future resulting in permanent disability and death and she and her family members voiced understanding.  We now she is going to stay however she states that she needs to be at work in the morning.  I do think she has the capacity to make the decision should she choose to leave AGAINST MEDICAL ADVICE [HD]  2320 Patient called into hospitalist for admission given finding of acute CVA.  Patient remains about any focal neurological symptoms and NIH stroke score of 0 still [HD]    Clinical Course User Index [HD] Nicholaus Rolland BRAVO, MD     FINAL CLINICAL IMPRESSION(S) / ED DIAGNOSES   Final diagnoses:  Acute CVA (cerebrovascular accident) [facility])  Right sided numbness     Rx / DC Orders   ED Discharge Orders     None        Note:  This document was prepared using Dragon voice recognition software and may include unintentional dictation errors.   Nicholaus Rolland BRAVO, MD 12/14/23 [phone]

## 2023-12-14 NOTE — ED Notes (Signed)
Code  stroke  called  to  [facility] 

## 2023-12-14 NOTE — ED Triage Notes (Signed)
 Pt arrived via ED from work with right side weakness and numbness. Pt sts that she was at work when she started to fall. Pt is tearful at this time. PT is A/Ox4.

## 2023-12-14 NOTE — Consult Note (Addendum)
 [facility] Telemedicine Consult   Requesting Provider: Dr. Nicholaus Consult Participants: Dr. RONAL Lav, RN Jon Location of the provider: Home Location of the patient: [facility] ED bed 6  This consult was provided via telemedicine with 2-way video and audio communication. The patient/family was informed that care would be provided in this way and agreed to receive care in this manner.   Chief Complaint: Right-sided numbness  HPI: 53 year old with past medical history of hypertension, hyperlipidemia, tobacco abuse, presented to the ED for evaluation of sudden onset of right-sided numbness and weakness.  Reports last known well at 5 PM when at work suddenly started noticing numbness that started on the right face and mouth including the right teeth and went down to involve the right arm and leg.  She was out of balance when she started to walk. At this time, in bed, symptoms have resolved. NIH stroke scale on my initial examination-0. Has a history of migraines in the past but has not had one for many years.  No current headache. Reports taking a new supplement that she had ordered online and symptoms started nearly 30 minutes after ingestion of the supplement.   Past Medical History:  Diagnosis Date   Hypertension     No current facility-administered medications for this encounter.  Current Outpatient Medications:    cyclobenzaprine  (FLEXERIL ) 5 MG tablet, Take 1 tablet (5 mg total) by mouth 3 (three) times daily as needed for muscle spasms., Disp: 12 tablet, Rfl: 0   ipratropium (ATROVENT ) 0.06 % nasal spray, Place 2 sprays into both nostrils 4 (four) times daily., Disp: 15 mL, Rfl: 12   losartan (COZAAR) 25 MG tablet, Take 25 mg by mouth daily., Disp: , Rfl:    oxyCODONE -acetaminophen  (PERCOCET) 5-325 MG tablet, Take 2 tablets by mouth every 6 (six) hours as needed for moderate pain or severe pain., Disp: 30 tablet, Rfl: 0   pantoprazole  (PROTONIX ) 40 MG tablet, Take 1 tablet  (40 mg total) by mouth daily., Disp: 30 tablet, Rfl: 1    LKW: 1700 hrs. IV thrombolysis given?: No, too mild to treat-NIH 0 IR Thrombectomy? No, exam not consistent with an LVO Modified Rankin Scale: 0-Completely asymptomatic and back to baseline post- stroke Time of teleneurologist evaluation: 1816 hrs.  Exam: There were no vitals filed for this visit.  General: Awake alert in no distress Neurological exam Awake alert oriented x 3.  No evidence of dysarthria or aphasia.  Cranial nerves II through XII intact.  Motor examination with no drift in any of the 4 extremities.  Sensation intact to light touch all over without extinction.  No gross dysmetria noted   NIHSS 1A: Level of Consciousness - 0 1B: Ask Month and Age - 0 1C: 'Blink Eyes' & 'Squeeze Hands' - 0 2: Test Horizontal Extraocular Movements - 0 3: Test Visual Fields - 0 4: Test Facial Palsy - 0 5A: Test Left Arm Motor Drift - 0 5B: Test Right Arm Motor Drift - 0 6A: Test Left Leg Motor Drift - 0 6B: Test Right Leg Motor Drift - 0 7: Test Limb Ataxia - 0 8: Test Sensation - 0 9: Test Language/Aphasia- 0 10: Test Dysarthria - 0 11: Test Extinction/Inattention - 0 NIHSS score: 0   Imaging Reviewed: Noncontrast head CT with no acute findings.  Labs reviewed in epic and pertinent values follow: CBC    Component Value Date/Time   WBC 7.9 03/16/2020 0744   RBC 4.52 03/16/2020 0744   HGB 12.8 03/16/2020 0744  HGB 11.4 (L) 11/02/2012 0544   HCT 39.5 03/16/2020 0744   HCT 33.9 (L) 11/02/2012 0544   PLT 262 03/16/2020 0744   PLT 223 11/02/2012 0544   MCV 87.4 03/16/2020 0744   MCV 85 11/02/2012 0544   MCH 28.3 03/16/2020 0744   MCHC 32.4 03/16/2020 0744   RDW 14.6 03/16/2020 0744   RDW 14.7 (H) 11/02/2012 0544   LYMPHSABS 2.5 03/16/2020 0744   LYMPHSABS 3.8 (H) 11/02/2012 0544   MONOABS 0.4 03/16/2020 0744   MONOABS 0.6 11/02/2012 0544   EOSABS 0.1 03/16/2020 0744   EOSABS 0.2 11/02/2012 0544   BASOSABS  0.0 03/16/2020 0744   BASOSABS 0.0 11/02/2012 0544   CMP     Component Value Date/Time   NA 140 03/16/2020 0744   NA 142 11/02/2012 0544   K 4.2 03/16/2020 0744   K 4.1 11/02/2012 0544   CL 105 03/16/2020 0744   CL 110 (H) 11/02/2012 0544   CO2 26 03/16/2020 0744   CO2 26 11/02/2012 0544   GLUCOSE 105 (H) 03/16/2020 0744   GLUCOSE 72 11/02/2012 0544   BUN 9 03/16/2020 0744   BUN 10 11/02/2012 0544   CREATININE 0.73 03/16/2020 0744   CREATININE 0.81 11/02/2012 0544   CALCIUM  9.6 03/16/2020 0744   CALCIUM  8.5 11/02/2012 0544   PROT 7.6 05/03/2015 0837   PROT 7.0 11/22/2012 0604   ALBUMIN 4.3 05/03/2015 0837   ALBUMIN 3.5 11/22/2012 0604   AST 13 (L) 05/03/2015 0837   AST 12 (L) 11/22/2012 0604   ALT 12 (L) 05/03/2015 0837   ALT 14 11/22/2012 0604   ALKPHOS 57 05/03/2015 0837   ALKPHOS 83 11/22/2012 0604   BILITOT 0.6 05/03/2015 0837   BILITOT 0.2 11/22/2012 0604   GFRNONAA >60 03/16/2020 0744   GFRNONAA >60 11/02/2012 0544   GFRAA >60 05/03/2015 0837   GFRAA >60 11/02/2012 0544     Assessment: 53 year old with past medical history of hypertension, hyperlipidemia, tobacco abuse, remote history of migraines with no recent headaches resented for evaluation of right-sided paresthesias and weakness along with gait instability.  Symptoms at this time on camera evaluation reveal an NIH stroke scale of 0. Symptoms too mild to treat with IV thrombolysis. Clinically no evidence of ELVO to rush for advanced imaging. Given focality of symptoms, history of headaches, risk factors, and MRI should be done to evaluate further for any evidence of a small lacunar infarction involving the thalamus or sensory cortex. Other differentials include complicated migraine versus hypertensive urgency  I am unclear if the ingestion of a new supplement has anything to do with it-I told her that supplements are not as strictly regulated as medicines, hence the side effects are not advertised as  well-and I cannot be sure whether this was related to any kind of supplement intake    Recommendations:  Stat MRI brain without contrast Migraine cocktail ordered with Toradol , Reglan  and Benadryl  IV x 1. If positive for stroke, admit for full stroke workup including CTA head and neck, 2D echocardiogram, lipid panel, frequent neurochecks, telemetry and therapy assessments. If negative for stroke, no further inpatient workup Blood pressures are elevated.  Allow permissive hypertension until MRI is done.  If the MRI is negative for stroke, treat as hypertensive urgency otherwise if MRI is positive, continue to allow permissive hypertension and treat only if systolic is greater than 220 on a as needed basis.  At this time, her NIH stroke scale is 0.  Her clock for last known  well is now reset.  If she has recurrence of symptoms, reactivate a code stroke at that time.  This was relayed to the bedside RN over the camera.  Plan relayed to Dr. Nicholaus, EDP.  Plan also discussed with patient and daughter who is at bedside.  -- Eligio Lav, MD Neurologist [facility] Pager: [phone]

## 2023-12-14 NOTE — H&P (Incomplete)
 History and Physical    Jenna Flowers:[id number] DOB: [birth year] DOA: 12/14/2023  Referring MD/NP/PA:   PCP: Jenna Norleen JONETTA, MD   Patient coming from:  The patient is coming from home.     Chief Complaint: Right-sided numbness  HPI: Jenna Flowers is a 53 y.o. female with medical history significant of HTN, HLD, tobacco abuse, obesity, migraine, who presents with right-sided numbness.  Patient reports that she was last known normal around 5 PM at work when she suddenly started having numbness and tingling on the right face and mouth, then spreading down to right arm and leg.  She said that she had mild weakness in the right arm and leg, and poor balance.  Her symptoms have resolved at arrival to ED. Patient states that she had history of migraine in the past, but has not had one for many years.  Currently no headache.  Patient does not have chest pain, cough, SOB.  No nausea, vomiting, diarrhea or abdominal pain.  No symptoms of UTI.  No fever or chills.  Data reviewed independently and ED Course: pt was found to have WBC 7.7, renal function okay, alcohol level less than 15, temperature normal, blood pressure 200/96 --> 165/91, heart rate 50-70s, RR 13, oxygen saturation 100% on room air.  CT of head negative for acute intracranial abnormalities.  MRI of the brain showed small stroke. CTA of head and neck negative for LVO. Dr. Voncile of neurology was consulted for code stroke  MRI of brain: Small acute infarct in the posterior limb of the left internal capsule.  CTA of head and neck: No emergent large vessel occlusion or proximal hemodynamically significant stenosis.   EKG:  Not done in ED, will get one.     Review of Systems:   General: no fevers, chills, no body weight gain, has fatigue HEENT: no blurry vision, hearing changes or sore throat Respiratory: no dyspnea, coughing, wheezing CV: no chest pain, no palpitations GI: no nausea, vomiting, abdominal pain,  diarrhea, constipation GU: no dysuria, burning on urination, increased urinary frequency, hematuria  Ext: no leg edema Neuro: no vision change or hearing loss. Has right sided numbness. Skin: no rash, no skin tear. MSK: No muscle spasm, no deformity, no limitation of range of movement in spin Heme: No easy bruising.  Travel history: No recent long distant travel.   Allergy:  Allergies  Allergen Reactions   Amoxicillin     Other reaction(s): Other (See Comments) Hypotension, n/v; ALL PENICILLINS   Codeine Shortness Of Breath   Codeine Sulfate Shortness Of Breath   Penicillins Diarrhea and Nausea And Vomiting    Past Medical History:  Diagnosis Date   HLD (hyperlipidemia)    Hypertension    Obesity    Smoker     Past Surgical History:  Procedure Laterality Date   CHOLECYSTECTOMY      Social History:  reports that she has been smoking cigarettes. She has never used smokeless tobacco. She reports that she does not drink alcohol and does not use drugs.  Family History:  Family History  Problem Relation Age of Onset   Diabetes Mother    Diabetes Father    Breast cancer Neg Hx      Prior to Admission medications   Medication Sig Start Date End Date Taking? Authorizing Provider  acetaminophen  (TYLENOL ) 500 MG tablet Take 1,000 mg by mouth every 6 (six) hours as needed for mild pain (pain score 1-3) or moderate pain (pain score 4-6).  Yes [provider]  losartan (COZAAR) 100 MG tablet Take 100 mg by mouth daily. 10/27/23 10/26/24 Yes [provider]  meloxicam (MOBIC) 15 MG tablet Take 15 mg by mouth daily as needed for pain (arthritis).   Yes [provider]  Multiple Vitamins-Minerals (ALIVE MULTI-VITAMIN PO) Take 1 tablet by mouth daily.   Yes [provider]  rosuvastatin  (CRESTOR ) 5 MG tablet Take 5 mg by mouth daily.   Yes [provider]    Physical Exam: Vitals:   12/14/23 2315 12/14/23 2334 12/14/23 2357 12/14/23 2359   BP:  (!) 188/86 (!) 186/91   Pulse:  83 64   Resp:  (!) 23 18   Temp: 98 F (36.7 C)  97.8 F (36.6 C)   TempSrc: Oral     SpO2:  100% 100%   Weight:    85.7 kg  Height:    5' (1.524 m)   General: Not in acute distress HEENT:       Eyes: PERRL, EOMI, no jaundice       ENT: No discharge from the ears and nose, no pharynx injection, no tonsillar enlargement.        Neck: No JVD, no bruit, no mass felt. Heme: No neck lymph node enlargement. Cardiac: S1/S2, RRR, No murmurs, No gallops or rubs. Respiratory: No rales, wheezing, rhonchi or rubs. GI: Soft, nondistended, nontender, no rebound pain, no organomegaly, BS present. GU: No hematuria Ext: No pitting leg edema bilaterally. 1+DP/PT pulse bilaterally. Musculoskeletal: No joint deformities, No joint redness or warmth, no limitation of ROM in spin. Skin: No rashes.  Neuro: Alert, oriented X3, cranial nerves II-XII grossly intact, moves all extremities normally. Muscle strength 5/5 in all extremities, sensation to light touch intact.  Psych: Patient is not psychotic, no suicidal or hemocidal ideation.  Labs on Admission: I have personally reviewed following labs and imaging studies  CBC: Recent Labs  Lab 12/14/23 1827  WBC 7.7  NEUTROABS 3.9  HGB 13.2  HCT 39.4  MCV 86.4  PLT 245   Basic Metabolic Panel: Recent Labs  Lab 12/14/23 1827  NA 135  K 3.8  CL 101  CO2 24  GLUCOSE 114*  BUN 8  CREATININE 0.74  CALCIUM  9.6   GFR: Estimated Creatinine Clearance: 79.1 mL/min (by C-G formula based on SCr of 0.74 mg/dL). Liver Function Tests: Recent Labs  Lab 12/14/23 1827  AST 16  ALT 13  ALKPHOS 92  BILITOT 0.7  PROT 7.9  ALBUMIN 4.3   No results for input(s): LIPASE, AMYLASE in the last 168 hours. No results for input(s): AMMONIA in the last 168 hours. Coagulation Profile: Recent Labs  Lab 12/14/23 1827  INR 1.0   Cardiac Enzymes: No results for input(s): CKTOTAL, CKMB, CKMBINDEX, TROPONINI  in the last 168 hours. BNP (last 3 results) No results for input(s): PROBNP in the last 8760 hours. HbA1C: No results for input(s): HGBA1C in the last 72 hours. CBG: Recent Labs  Lab 12/14/23 1806  GLUCAP 123*   Lipid Profile: No results for input(s): CHOL, HDL, LDLCALC, TRIG, CHOLHDL, LDLDIRECT in the last 72 hours. Thyroid Function Tests: No results for input(s): TSH, T4TOTAL, FREET4, T3FREE, THYROIDAB in the last 72 hours. Anemia Panel: No results for input(s): VITAMINB12, FOLATE, FERRITIN, TIBC, IRON, RETICCTPCT in the last 72 hours. Urine analysis:    Component Value Date/Time   COLORURINE STRAW (A) 03/16/2020 0339   APPEARANCEUR CLEAR (A) 03/16/2020 0339   APPEARANCEUR Clear 10/31/2012 1253   LABSPEC  1.003 (L) 03/16/2020 0339   LABSPEC 1.011 10/31/2012 1253   PHURINE 6.0 03/16/2020 0339   GLUCOSEU NEGATIVE 03/16/2020 0339   GLUCOSEU Negative 10/31/2012 1253   HGBUR SMALL (A) 03/16/2020 0339   BILIRUBINUR NEGATIVE 03/16/2020 0339   BILIRUBINUR Negative 10/31/2012 1253   KETONESUR NEGATIVE 03/16/2020 0339   PROTEINUR NEGATIVE 03/16/2020 0339   NITRITE NEGATIVE 03/16/2020 0339   LEUKOCYTESUR NEGATIVE 03/16/2020 0339   LEUKOCYTESUR Negative 10/31/2012 1253   Sepsis Labs: @LABRCNTIP (procalcitonin:4,lacticidven:4) )No results found for this or any previous visit (from the past 240 hours).   Radiological Exams on Admission:   Assessment/Plan Principal Problem:   Stroke [facility]) Active Problems:   HLD (hyperlipidemia)   Hypertension   Smoker   Obesity   Assessment and Plan:  Stroke [facility]): Dr. Voncile of neurology is consulted.  Initially suspected patient may have migraine, and migraine cocktail was given.  MRI of the brain showed small acute infarct in the posterior limb of the left internal capsule. CTA negative for LVO.  -Placed on tele bed for observation -will hold oral Bp meds to allow permissive HTN in the setting of  acute stroke -ASA  -Statin:  Increase home Crestor  dose from 5 to 20 mg daily - fasting lipid panel and HbA1c  - 2D transthoracic echocardiography  - swallowing screen. If fails, will get SLP - Check UDS  - PT/OT consult - F/u EKG which is not done yet in ED  HLD (hyperlipidemia): -Cestor  Hypertension - prn IV hydralazine  for SBP>220 or dBP>110 -hold Cozaar  Smoker -Did counseling about importance of quitting smoking - Nicotine  patch  Obesity: Patient has Obesity Class II, with body weight 85.7  Kg and BMI 36.9 kg/m2.  - Encourage losing weight - Exercise and healthy diet     DVT ppx:  SQ Lovenox   Code Status: Full code   Family Communication:   Yes, patient's husband at bed side.       Disposition Plan:  Anticipate discharge back to previous environment  Consults called: Dr. Voncile of neurology  Admission status and Level of care: Telemetry Medical:    for obs    Dispo: The patient is from: Home              Anticipated d/c is to: Home              Anticipated d/c date is: 1 day              Patient currently is not medically stable to d/c.    Severity of Illness:  The appropriate patient status for this patient is OBSERVATION. Observation status is judged to be reasonable and necessary in order to provide the required intensity of service to ensure the patient's safety. The patient's presenting symptoms, physical exam findings, and initial radiographic and laboratory data in the context of their medical condition is felt to place them at decreased risk for further clinical deterioration. Furthermore, it is anticipated that the patient will be medically stable for discharge from the hospital within 2 midnights of admission.        Date of Service 12/15/2023    Caleb Exon [facility]   If 7PM-7AM, please contact night-coverage [email or web address] 12/15/2023, 1:45 AM

## 2023-12-14 NOTE — ED Notes (Signed)
 Placed fall bundle

## 2023-12-14 NOTE — ED Notes (Signed)
 Pt will go to room 6 and CT 3

## 2023-12-15 ENCOUNTER — Observation Stay
Admit: 2023-12-15 | Discharge: 2023-12-15 | Disposition: A | Discharge status: Home / Self Care | Attending: Internal Medicine | Admitting: Internal Medicine

## 2023-12-15 DIAGNOSIS — I6381 Other cerebral infarction due to occlusion or stenosis of small artery: Secondary | ICD-10-CM | POA: Diagnosis not present

## 2023-12-15 DIAGNOSIS — I10 Essential (primary) hypertension: Secondary | ICD-10-CM | POA: Diagnosis not present

## 2023-12-15 DIAGNOSIS — I639 Cerebral infarction, unspecified: Secondary | ICD-10-CM

## 2023-12-15 DIAGNOSIS — I739 Peripheral vascular disease, unspecified: Secondary | ICD-10-CM | POA: Diagnosis not present

## 2023-12-15 DIAGNOSIS — R29700 NIHSS score 0: Secondary | ICD-10-CM | POA: Diagnosis not present

## 2023-12-15 LAB — LIPID PANEL
Cholesterol: 126 mg/dL (ref 0–200)
HDL: 25 mg/dL — ABNORMAL LOW (ref >40)
LDL Cholesterol: 68 mg/dL (ref 0–99)
Total CHOL/HDL Ratio: 5 ratio
Triglycerides: 164 mg/dL — ABNORMAL HIGH (ref <150)
VLDL: 33 mg/dL (ref 0–40)

## 2023-12-15 LAB — ECHOCARDIOGRAM COMPLETE
AR max vel: 2.69 cm2
AV Area VTI: 2.62 cm2
AV Area mean vel: 2.6 cm2
AV Mean grad: 3 mmHg
AV Peak grad: 6.2 mmHg
Ao pk vel: 1.24 m/s
Area-P 1/2: 4.49 cm2
Calc EF: 56.9 %
Height: 60 in
MV VTI: 2.2 cm2
S' Lateral: 2.3 cm
Single Plane A2C EF: 64.3 %
Single Plane A4C EF: 49.5 %
Weight: 3022.95 [oz_av]

## 2023-12-15 LAB — SEDIMENTATION RATE: Sed Rate: 14 mm/h (ref 0–30)

## 2023-12-15 LAB — HEMOGLOBIN A1C
Hgb A1c MFr Bld: 6.2 % — ABNORMAL HIGH (ref 4.8–5.6)
Mean Plasma Glucose: 131.24 mg/dL

## 2023-12-15 LAB — URINE DRUG SCREEN, QUALITATIVE (ARMC ONLY)
Amphetamines, Ur Screen: NOT DETECTED
Barbiturates, Ur Screen: NOT DETECTED
Benzodiazepine, Ur Scrn: NOT DETECTED
Cannabinoid 50 Ng, Ur ~~LOC~~: NOT DETECTED
Cocaine Metabolite,Ur ~~LOC~~: NOT DETECTED
MDMA (Ecstasy)Ur Screen: NOT DETECTED
Methadone Scn, Ur: NOT DETECTED
Opiate, Ur Screen: NOT DETECTED
Phencyclidine (PCP) Ur S: NOT DETECTED
Tricyclic, Ur Screen: NOT DETECTED

## 2023-12-15 LAB — PREGNANCY, URINE: Preg Test, Ur: NEGATIVE

## 2023-12-15 LAB — HIV ANTIBODY (ROUTINE TESTING W REFLEX): HIV Screen 4th Generation wRfx: NONREACTIVE

## 2023-12-15 MED ORDER — NICOTINE 21 MG/24HR TD PT24: 21.0000 mg | MEDICATED_PATCH | Freq: Every day | TRANSDERMAL | 0 refills | Status: AC

## 2023-12-15 MED ORDER — CLOPIDOGREL BISULFATE 75 MG PO TABS: 75.0000 mg | ORAL_TABLET | Freq: Every day | ORAL | 0 refills | Status: AC

## 2023-12-15 MED ORDER — ASPIRIN 81 MG PO TBEC: 81.0000 mg | DELAYED_RELEASE_TABLET | Freq: Every day | ORAL | 12 refills | Status: DC

## 2023-12-15 NOTE — Plan of Care (Signed)
 EKG placed in chart. Showed NSR.

## 2023-12-15 NOTE — Discharge Summary (Signed)
 Physician Discharge Summary   Patient: Jenna Flowers MRN: [id number] DOB: 29-Nov-1970  Admit date:     12/14/2023  Discharge date: 12/15/23  Discharge Physician: Delon Herald   PCP: Rudolpho Norleen JONETTA, MD   Recommendations at discharge:   You are being referred for outpatient physical and occupational therapy Take aspirin  81 mg daily indefinitely Take Plavix  daily for 3 weeks in addition to aspirin  STOP smoking!  Nicotine  patch prescribed Follow up with Dr. Rudolpho in 1-2 weeks  Discharge Diagnoses: Principal Problem:   Stroke [facility]) Active Problems:   HLD (hyperlipidemia)   Hypertension   Smoker   Obesity    Hospital Course: 52yo with h/o HTN, HLD, tobacco use, migraines, and class 2 obesity who presented on 8/18 with R-sided numbness.  MRI with small acute infarct in posterior limb of internal capsule.  Neurology is consulting.  Assessment and Plan:  Acute CVA Patient presented with R-sided numbness Initially suspected patient may have migraine, and migraine cocktail was given MRI of the brain showed small acute infarct in the posterior limb of the left internal capsule CTA negative for LVO Placed on tele bed for observation DAPT x 3 weeks with ASA and Plavix  and then ASA monotherapy Lipids are well controlled; continue home dose of statin Recommended for outpatient PT/OT Echo with grade 2 DD but no cardiogenic source of CVA   HLD (hyperlipidemia) LDL 68, good control Continue Crestor    Hypertension Resume Cozaar on 8/20   Smoker Did counseling about importance of quitting smoking Nicotine  patch prescribed  PreDM A1c 6.2 Weight loss and exercise are encouraged   Class 2 obesity Body mass index is 36.9 kg/m.SABRA  Weight loss should be encouraged Outpatient PCP/bariatric medicine f/u encouraged Significantly low or high BMI is associated with higher medical risk including morbidity and mortality        Pain control - [place]  Controlled  Substance Reporting System database was reviewed. and patient was instructed, not to drive, operate heavy machinery, perform activities at heights, swimming or participation in water activities or provide baby-sitting services while on Pain, Sleep and Anxiety Medications; until their outpatient Physician has advised to do so again. Also recommended to not to take more than prescribed Pain, Sleep and Anxiety Medications.   Disposition: Home Diet recommendation:  Cardiac diet DISCHARGE MEDICATION: Allergies as of 12/15/2023       Reactions   Amoxicillin    Other reaction(s): Other (See Comments) Hypotension, n/v; ALL PENICILLINS   Codeine Shortness Of Breath   Codeine Sulfate Shortness Of Breath   Penicillins Diarrhea, Nausea And Vomiting        Medication List     STOP taking these medications    meloxicam 15 MG tablet Commonly known as: MOBIC       TAKE these medications    acetaminophen  500 MG tablet Commonly known as: TYLENOL  Take 1,000 mg by mouth every 6 (six) hours as needed for mild pain (pain score 1-3) or moderate pain (pain score 4-6).   ALIVE MULTI-VITAMIN PO Take 1 tablet by mouth daily.   aspirin  EC 81 MG tablet Take 1 tablet (81 mg total) by mouth daily. Swallow whole.   clopidogrel  75 MG tablet Commonly known as: Plavix  Take 1 tablet (75 mg total) by mouth daily for 21 days.   losartan 100 MG tablet Commonly known as: COZAAR Take 100 mg by mouth daily.   nicotine  21 mg/24hr patch Commonly known as: NICODERM CQ  - dosed in mg/24 hours Place 1 patch (  21 mg total) onto the skin daily. Start taking on: December 16, 2023   rosuvastatin  5 MG tablet Commonly known as: CRESTOR  Take 5 mg by mouth daily.        Follow-up Information     Rudolpho Norleen BIRCH, MD Follow up.   Specialty: Internal Medicine Why: hospital follow up Contact information: [address] [place] [address] [phone]                Discharge  Exam:   Subjective: Feeling better.  Still with some numbness with ambulation but generally feels good and wants to go home.   Objective: Vitals:   12/15/23 0757 12/15/23 1157  BP: (!) 155/92 (!) 166/91  Pulse: 65 78  Resp: (!) 24   Temp: 97.6 F (36.4 C) 98.3 F (36.8 C)  SpO2: 96% 94%    Intake/Output Summary (Last 24 hours) at 12/15/2023 1303 Last data filed at 12/15/2023 0900 Gross per 24 hour  Intake 326.98 ml  Output --  Net 326.98 ml   Filed Weights   12/14/23 2359  Weight: 85.7 kg    Exam:  General:  Appears calm and comfortable and is in NAD Eyes:   normal lids, iris ENT:  grossly normal hearing, lips & tongue, mmm Cardiovascular:  RRR. No LE edema.  Respiratory:   CTA bilaterally with no wheezes/rales/rhonchi.  Normal respiratory effort. Abdomen:  soft, NT, ND Skin:  no rash or induration seen on limited exam Musculoskeletal:  grossly normal tone BUE/BLE, good ROM, no bony abnormality Psychiatric:  grossly normal mood and affect, speech fluent and appropriate, AOx3 Neurologic:  CN 2-12 grossly intact, moves all extremities in coordinated fashion, sensation intact  Data Reviewed: I have reviewed the patient's lab results since admission.  Pertinent labs for today include:  Lipids: 126/25/68/164 A1c 6.2    Condition at discharge: stable  The results of significant diagnostics from this hospitalization (including imaging, microbiology, ancillary and laboratory) are listed below for reference.   Imaging Studies: ECHOCARDIOGRAM COMPLETE Result Date: 12/15/2023    ECHOCARDIOGRAM REPORT   Patient Name:   LATORI BEGGS Date of Exam: 12/15/2023 Medical Rec #:  [id number]          Height:       60.0 in Accession #:    [id number]         Weight:       188.9 lb Date of Birth:  [birth year]          BSA:          1.822 m Patient Age:    52 years           BP:           194/108 mmHg Patient Gender: F                  HR:           62 bpm. Exam Location:  [facility]  Procedure: 2D Echo, Color Doppler and Cardiac Doppler (Both Spectral and Color            Flow Doppler were utilized during procedure). Indications:     Stroke I63.9  History:         Patient has no prior history of Echocardiogram examinations.  Sonographer:     Ashley McNeely-Sloane Referring Phys:  [id number] XILIN NIU Diagnosing Phys: Cara BIRCH Lovelace MD IMPRESSIONS  1. Left ventricular ejection fraction, by estimation, is 70 to 75%. The left ventricle has  hyperdynamic function. The left ventricle has no regional wall motion abnormalities. Left ventricular diastolic parameters are consistent with Grade II diastolic dysfunction (pseudonormalization).  2. Right ventricular systolic function is normal. The right ventricular size is normal.  3. The mitral valve is normal in structure. Trivial mitral valve regurgitation.  4. The aortic valve is normal in structure. Aortic valve regurgitation is not visualized. FINDINGS  Left Ventricle: Left ventricular ejection fraction, by estimation, is 70 to 75%. The left ventricle has hyperdynamic function. The left ventricle has no regional wall motion abnormalities. Strain was performed and the global longitudinal strain is indeterminate. The left ventricular internal cavity size was normal in size. There is borderline concentric left ventricular hypertrophy. Left ventricular diastolic parameters are consistent with Grade II diastolic dysfunction (pseudonormalization). Right Ventricle: The right ventricular size is normal. No increase in right ventricular wall thickness. Right ventricular systolic function is normal. Left Atrium: Left atrial size was normal in size. Right Atrium: Right atrial size was normal in size. Pericardium: There is no evidence of pericardial effusion. Mitral Valve: The mitral valve is normal in structure. Trivial mitral valve regurgitation. MV peak gradient, 4.6 mmHg. The mean mitral valve gradient is 2.0 mmHg. Tricuspid Valve: The tricuspid valve is normal in  structure. Tricuspid valve regurgitation is mild. Aortic Valve: The aortic valve is normal in structure. Aortic valve regurgitation is not visualized. Aortic valve mean gradient measures 3.0 mmHg. Aortic valve peak gradient measures 6.2 mmHg. Aortic valve area, by VTI measures 2.62 cm. Pulmonic Valve: The pulmonic valve was grossly normal. Pulmonic valve regurgitation is not visualized. Aorta: The ascending aorta was not well visualized. IAS/Shunts: No atrial level shunt detected by color flow Doppler. Additional Comments: 3D was performed not requiring image post processing on an independent workstation and was indeterminate.  LEFT VENTRICLE PLAX 2D LVIDd:         4.10 cm     Diastology LVIDs:         2.30 cm     LV e' medial:    8.05 cm/s LV PW:         1.10 cm     LV E/e' medial:  12.4 LV IVS:        1.00 cm     LV e' lateral:   9.68 cm/s LVOT diam:     2.00 cm     LV E/e' lateral: 10.3 LV SV:         73 LV SV Index:   40 LVOT Area:     3.14 cm  LV Volumes (MOD) LV vol d, MOD A2C: 64.2 ml LV vol d, MOD A4C: 78.8 ml LV vol s, MOD A2C: 22.9 ml LV vol s, MOD A4C: 39.8 ml LV SV MOD A2C:     41.3 ml LV SV MOD A4C:     78.8 ml LV SV MOD BP:      42.8 ml RIGHT VENTRICLE             IVC RV Basal diam:  4.00 cm     IVC diam: 1.50 cm RV Mid diam:    3.10 cm RV S prime:     11.30 cm/s TAPSE (M-mode): 1.9 cm LEFT ATRIUM             Index        RIGHT ATRIUM           Index LA diam:        3.30 cm 1.81 cm/m   RA Area:  14.00 cm LA Vol (A2C):   31.7 ml 17.40 ml/m  RA Volume:   34.00 ml  18.66 ml/m LA Vol (A4C):   33.9 ml 18.61 ml/m LA Biplane Vol: 34.1 ml 18.72 ml/m  AORTIC VALVE                    PULMONIC VALVE AV Area (Vmax):    2.69 cm     PV Vmax:        0.88 m/s AV Area (Vmean):   2.60 cm     PV Vmean:       59.900 cm/s AV Area (VTI):     2.62 cm     PV VTI:         0.212 m AV Vmax:           124.00 cm/s  PV Peak grad:   3.1 mmHg AV Vmean:          79.800 cm/s  PV Mean grad:   2.0 mmHg AV VTI:             0.277 m      RVOT Peak grad: 1 mmHg AV Peak Grad:      6.2 mmHg AV Mean Grad:      3.0 mmHg LVOT Vmax:         106.00 cm/s LVOT Vmean:        66.000 cm/s LVOT VTI:          0.231 m LVOT/AV VTI ratio: 0.83  AORTA Ao Root diam: 2.90 cm Ao Asc diam:  2.50 cm MITRAL VALVE MV Area (PHT): 4.49 cm    SHUNTS MV Area VTI:   2.20 cm    Systemic VTI:  0.23 m MV Peak grad:  4.6 mmHg    Systemic Diam: 2.00 cm MV Mean grad:  2.0 mmHg    Pulmonic VTI:  0.127 m MV Vmax:       1.07 m/s MV Vmean:      72.9 cm/s MV Decel Time: 169 msec MV E velocity: 99.60 cm/s MV A velocity: 82.70 cm/s MV E/A ratio:  1.20 Dwayne D Callwood MD Electronically signed by Cara JONETTA Lovelace MD Signature Date/Time: 12/15/2023/12:42:38 PM    Final    CT ANGIO HEAD NECK W WO CM (CODE STROKE) Result Date: 12/14/2023 CLINICAL DATA:  Neuro deficit, acute, stroke suspected EXAM: CT ANGIOGRAPHY HEAD AND NECK WITH AND WITHOUT CONTRAST TECHNIQUE: Multidetector CT imaging of the head and neck was performed using the standard protocol during bolus administration of intravenous contrast. Multiplanar CT image reconstructions and MIPs were obtained to evaluate the vascular anatomy. Carotid stenosis measurements (when applicable) are obtained utilizing NASCET criteria, using the distal internal carotid diameter as the denominator. RADIATION DOSE REDUCTION: This exam was performed according to the departmental dose-optimization program which includes automated exposure control, adjustment of the mA and/or kV according to patient size and/or use of iterative reconstruction technique. CONTRAST:  75mL OMNIPAQUE  IOHEXOL  350 MG/ML SOLN COMPARISON:  None Available. FINDINGS: CTA NECK FINDINGS Aortic arch: Great vessel origins are patent without significant stenosis. Right carotid system: No evidence of dissection, stenosis (50% or greater), or occlusion. Left carotid system: No evidence of dissection, stenosis (50% or greater), or occlusion. Vertebral arteries: Codominant.  No evidence of dissection, stenosis (50% or greater), or occlusion. Skeleton: No evidence of acute abnormality. Other neck: No evidence of acute abnormality on limited assessment. Upper chest: Lung apices are clear. Review of the MIP images confirms the above findings  CTA HEAD FINDINGS Anterior circulation: Bilateral intracranial ICAs, MCAs, and ACAs are patent without proximal hemodynamically significant stenosis. No aneurysm identified. Posterior circulation: Bilateral intradural vertebral arteries, basilar artery and bilateral post cerebral arteries are patent without proximal hemodynamically significant stenosis. No aneurysm identified. Venous sinuses: As permitted by contrast timing, patent. Review of the MIP images confirms the above findings IMPRESSION: No emergent large vessel occlusion or proximal hemodynamically significant stenosis. Electronically Signed   By: Gilmore GORMAN Molt M.D.   On: 12/14/2023 23:40   MR BRAIN WO CONTRAST Result Date: 12/14/2023 CLINICAL DATA:  Neuro deficit, acute, stroke suspected EXAM: MRI HEAD WITHOUT CONTRAST TECHNIQUE: Multiplanar, multiecho pulse sequences of the brain and surrounding structures were obtained without intravenous contrast. COMPARISON:  CT head from earlier today. FINDINGS: Brain: Small acute infarct in the posterior limb of the left internal capsule. Slight edema. No mass effect. No midline shift. No evidence of acute hemorrhage, mass lesion or hydrocephalus. Vascular: Normal flow voids. Skull and upper cervical spine: Normal marrow signal. Sinuses/Orbits: Negative. Other: No mastoid effusions. IMPRESSION: Small acute infarct in the posterior limb of the left internal capsule. Electronically Signed   By: Gilmore GORMAN Molt M.D.   On: 12/14/2023 22:54   CT HEAD CODE STROKE WO CONTRAST Result Date: 12/14/2023 EXAM: CT HEAD WITHOUT CONTRAST 12/14/2023 06:12:14 PM TECHNIQUE: CT of the head was performed without the administration of intravenous contrast.  Automated exposure control, iterative reconstruction, and/or weight based adjustment of the mA/kV was utilized to reduce the radiation dose to as low as reasonably achievable. COMPARISON: None available. CLINICAL HISTORY: Neuro deficit, acute, stroke suspected. FINDINGS: BRAIN AND VENTRICLES: No acute hemorrhage. Gray-white differentiation is preserved. No hydrocephalus. No extra-axial collection. No mass effect or midline shift. ORBITS: No acute abnormality. SINUSES: No acute abnormality. SOFT TISSUES AND SKULL: No acute soft tissue abnormality. No skull fracture. [place] stroke program early CT (aspect) score ----- Ganglionic (caudate, ic, Lentiform Nucleus, insula, M1-m3): 7 Supraganglionic (m4-m6): 3 Total: 10 IMPRESSION: 1. No acute intracranial abnormality. 2. ASPECT score: 10. 3. Findings messaged to Dr. Voncile at 6:30PM on 12/14/23. Electronically signed by: Donnice Mania MD 12/14/2023 06:31 PM EDT RP Workstation: [id number]   MM 3D SCREENING MAMMOGRAM BILATERAL BREAST Result Date: 12/07/2023 CLINICAL DATA:  Screening. EXAM: DIGITAL SCREENING BILATERAL MAMMOGRAM WITH TOMOSYNTHESIS AND CAD TECHNIQUE: Bilateral screening digital craniocaudal and mediolateral oblique mammograms were obtained. Bilateral screening digital breast tomosynthesis was performed. The images were evaluated with computer-aided detection. COMPARISON:  Previous exam(s). ACR Breast Density Category b: There are scattered areas of fibroglandular density. FINDINGS: There are no findings suspicious for malignancy. IMPRESSION: No mammographic evidence of malignancy. A result letter of this screening mammogram will be mailed directly to the patient. RECOMMENDATION: Screening mammogram in one year. (Code:[id number]) BI-RADS CATEGORY  1: Negative. Electronically Signed   By: Dina  Arceo M.D.   On: 12/07/2023 07:57    Microbiology: Results for orders placed or performed during the hospital encounter of 04/24/20  Resp Panel by RT-PCR (Flu A&B,  Covid) Nasopharyngeal Swab     Status: None   Collection Time: 04/24/20  8:07 PM   Specimen: Nasopharyngeal Swab; Nasopharyngeal(NP) swabs in vial transport medium  Result Value Ref Range Status   SARS Coronavirus 2 by RT PCR NEGATIVE NEGATIVE Final    Comment: (NOTE) SARS-CoV-2 target nucleic acids are NOT DETECTED.  The SARS-CoV-2 RNA is generally detectable in upper respiratory specimens during the acute phase of infection. The lowest concentration of SARS-CoV-2 viral copies this assay can detect is  138 copies/mL. A negative result does not preclude SARS-Cov-2 infection and should not be used as the sole basis for treatment or other patient management decisions. A negative result may occur with  improper specimen collection/handling, submission of specimen other than nasopharyngeal swab, presence of viral mutation(s) within the areas targeted by this assay, and inadequate number of viral copies(<138 copies/mL). A negative result must be combined with clinical observations, patient history, and epidemiological information. The expected result is Negative.  Fact Sheet for Patients:  BloggerCourse.com  Fact Sheet for Healthcare Providers:  SeriousBroker.it  This test is no t yet approved or cleared by the [place]  FDA and  has been authorized for detection and/or diagnosis of SARS-CoV-2 by FDA under an Emergency Use Authorization (EUA). This EUA will remain  in effect (meaning this test can be used) for the duration of the COVID-19 declaration under Section 564(b)(1) of the Act, 21 U.S.C.section 360bbb-3(b)(1), unless the authorization is terminated  or revoked sooner.       Influenza A by PCR NEGATIVE NEGATIVE Final   Influenza B by PCR NEGATIVE NEGATIVE Final    Comment: (NOTE) The Xpert Xpress SARS-CoV-2/FLU/RSV plus assay is intended as an aid in the diagnosis of influenza from Nasopharyngeal swab specimens and should  not be used as a sole basis for treatment. Nasal washings and aspirates are unacceptable for Xpert Xpress SARS-CoV-2/FLU/RSV testing.  Fact Sheet for Patients: BloggerCourse.com  Fact Sheet for Healthcare Providers: SeriousBroker.it  This test is not yet approved or cleared by the [place]  FDA and has been authorized for detection and/or diagnosis of SARS-CoV-2 by FDA under an Emergency Use Authorization (EUA). This EUA will remain in effect (meaning this test can be used) for the duration of the COVID-19 declaration under Section 564(b)(1) of the Act, 21 U.S.C. section 360bbb-3(b)(1), unless the authorization is terminated or revoked.  Performed at [facility], [address] [place], [place] [address]     Labs: CBC: Recent Labs  Lab 12/14/23 1827  WBC 7.7  NEUTROABS 3.9  HGB 13.2  HCT 39.4  MCV 86.4  PLT 245   Basic Metabolic Panel: Recent Labs  Lab 12/14/23 1827  NA 135  K 3.8  CL 101  CO2 24  GLUCOSE 114*  BUN 8  CREATININE 0.74  CALCIUM  9.6   Liver Function Tests: Recent Labs  Lab 12/14/23 1827  AST 16  ALT 13  ALKPHOS 92  BILITOT 0.7  PROT 7.9  ALBUMIN 4.3   CBG: Recent Labs  Lab 12/14/23 1806  GLUCAP 123*    Discharge time spent: greater than 30 minutes.  Signed: Delon Herald, MD [facility] 12/15/2023

## 2023-12-15 NOTE — Plan of Care (Signed)

## 2023-12-15 NOTE — Evaluation (Signed)
 Occupational Therapy Evaluation Patient Details Name: Jenna Flowers MRN: [id number] DOB: 12/02/1970 Today's Date: 12/15/2023   History of Present Illness   53 y/o female presented to ED on 12/14/23 for R sided weakness and numbness. 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 PMH: HTN, tobacco abuse, obesity, migraines    Clinical Impressions Pt seen for OT evaluation this date. 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 Currently pt reporting numbness has resolved but becomes tearful sharing that she feels like her RLE>RUE are not 100% back to baseline, stating I don't feel fully in control. Pt cites balance off and feeling more confident while holding onto the railing in the hall with PT earlier and endorses knocking into items on her meal tray. Pt demonstrates mild FMC versus mild inattention deficits resulting in decreased balance and decreased quality of movement and ADL performance. Pt/spouse instructed in [facility] and compensatory strategies for mild inattention deficits noted. Handout provided to support recall and carryover. Active listening and emotional support provided as well. Pt/spouse verbalized understanding. No additional acute OT needs at this time.     If plan is discharge home, recommend the following:   Assist for transportation     Functional Status Assessment   Patient has had a recent decline in their functional status and demonstrates the ability to make significant improvements in function in a reasonable and predictable amount of time.     Equipment Recommendations   None recommended by OT     Recommendations for Other Services         Precautions/Restrictions   Precautions Precautions: Fall Recall of Precautions/Restrictions: Intact Restrictions Weight Bearing Restrictions Per Provider Order: No     Mobility Bed Mobility Overal bed mobility:  Independent             General bed mobility comments: per pt/PT, pt reports just laying down to rest and declines to get up    Transfers Overall transfer level: Independent                 General transfer comment: per pt/PT, pt reports just laying down to rest and declines to get up      Balance Overall balance assessment: Mild deficits observed, not formally tested (Pt reports she feels much more secure when holding onto the rail in the hall while she was walking)            ADL either performed or assessed with clinical judgement   ADL Overall ADL's : Modified independent          General ADL Comments: increased time/effort with increased visual attention improving RUE coordination      Pertinent Vitals/Pain Pain Assessment Pain Assessment: No/denies pain     Extremity/Trunk Assessment Upper Extremity Assessment Upper Extremity Assessment: Right hand dominant;RUE deficits/detail RUE Deficits / Details: mild inattention to R side with grasping shoes and dropping when looking up, bumping into items on her breakfast tray when distracted, mild FMC deficits noted RUE Coordination: decreased fine motor   Lower Extremity Assessment Lower Extremity Assessment: Defer to PT evaluation;Overall [facility] for tasks assessed (testing WFL, however functionally balance per patient not at baseline)   Cervical / Trunk Assessment Cervical / Trunk Assessment: Normal   Communication Communication Communication: No apparent difficulties   Cognition Arousal: Alert Behavior During Therapy: WFL for tasks assessed/performed Cognition: No apparent impairments  OT - Cognition Comments: a bit tearful, endorsing feeling overwhelmed, questionable mild inattention with dual tasking                 Following commands: Intact          Exercises Other Exercises Other Exercises: Pt/spouse instructed in [facility] and compensatory strategies for mild inattention  deficits noted        Home Living Family/patient expects to be discharged to:: Private residence Living Arrangements: Spouse/significant other;Children;Other relatives Available Help at Discharge: Family Type of Home: House Home Access: Stairs to enter [employer] of Steps: 4 Entrance Stairs-Rails: Right;Left;Can reach both Home Layout: One level     Bathroom Shower/Tub: Producer, television/film/video: Standard     Home Equipment: None          Prior Functioning/Environment Prior Level of Function : Independent/Modified Independent;Working/employed;Driving               ADLs Comments: works at a Statistician Problem List: Decreased coordination;Impaired balance (sitting and/or standing)   OT Treatment/Interventions:        OT Goals(Current goals can be found in the care plan section)   Acute Rehab OT Goals Patient Stated Goal: be indep OT Goal Formulation: All assessment and education complete, DC therapy   OT Frequency:       Co-evaluation              AM-PAC OT 6 Clicks Daily Activity     Outcome Measure Help from another person eating meals?: None Help from another person taking care of personal grooming?: None Help from another person toileting, which includes using toliet, bedpan, or urinal?: None Help from another person bathing (including washing, rinsing, drying)?: None Help from another person to put on and taking off regular upper body clothing?: None Help from another person to put on and taking off regular lower body clothing?: None 6 Click Score: 24   End of Session Nurse Communication: Other (comment) (d/c recs)  Activity Tolerance: Patient tolerated treatment well Patient left: in bed;with call bell/phone within reach;with family/visitor present  OT Visit Diagnosis: Unsteadiness on feet (R26.81)                Time: 9092-9079 OT Time Calculation (min): 13 min Charges:  OT General Charges $OT Visit: 1  Visit OT Evaluation $OT Eval Low Complexity: 1 Low OT Treatments $Neuromuscular Re-education: 8-22 mins  Warren SAUNDERS., MPH, MS, OTR/L ascom [phone] 12/15/23, 10:11 AM

## 2023-12-15 NOTE — Evaluation (Signed)
 Physical Therapy Evaluation Patient Details Name: Jenna Flowers MRN: [id number] DOB: 1971-03-25 Today's Date: 12/15/2023  History of Present Illness  53 y/o female presented to ED on 12/14/23 for R sided weakness and numbness. 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 PMH: HTN, tobacco abuse, obesity, migraines  Clinical Impression  Patient admitted with the above. PTA, patient lives with husband and other family members and was independent with no AD and working in a Manufacturing systems engineer. Patient functioning overall at modI level with no AD with ongoing mild balance deficits although formal strength and coordination testing WFL. Encouraged patient to continue mobilizing along with referral to OPPT. Education provided on BEFAST acronym for s/s of a stroke, patient verbalized understanding and asked appropriate questions. No further skilled PT needs identified acutely. PT will sign off at this time.         If plan is discharge home, recommend the following: Help with stairs or ramp for entrance;Assist for transportation   Can travel by private vehicle        Equipment Recommendations None recommended by PT  Recommendations for Other Services       Functional Status Assessment Patient has had a recent decline in their functional status and demonstrates the ability to make significant improvements in function in a reasonable and predictable amount of time.     Precautions / Restrictions Precautions Precautions: Fall Recall of Precautions/Restrictions: Intact Restrictions Weight Bearing Restrictions Per Provider Order: No      Mobility  Bed Mobility Overal bed mobility: Independent                  Transfers Overall transfer level: Independent Equipment used: None                    Ambulation/Gait Ambulation/Gait assistance: Modified independent (Device/Increase time) Gait Distance (Feet): 200 Feet Assistive device: None Gait  Pattern/deviations: Step-through pattern, Decreased stride length, Drifts right/left Gait velocity: decreased     General Gait Details: slower speed compared to reported baseline. Drifting L/R throughout with patient reporting her balance is not back to baselien  Careers information officer     Tilt Bed    Modified Rankin (Stroke Patients Only)       Balance Overall balance assessment: Mild deficits observed, not formally tested                                           Pertinent Vitals/Pain Pain Assessment Pain Assessment: No/denies pain    Home Living Family/patient expects to be discharged to:: Private residence Living Arrangements: Spouse/significant other;Children;Other relatives Available Help at Discharge: Family Type of Home: House Home Access: Stairs to enter Entrance Stairs-Rails: Right;Left;Can reach both Entrance Stairs-Number of Steps: 4   Home Layout: One level Home Equipment: None      Prior Function Prior Level of Function : Independent/Modified Independent;Working/employed;Driving                     Extremity/Trunk Assessment   Upper Extremity Assessment Upper Extremity Assessment: Overall WFL for tasks assessed (mild inattention to R side with grasping shoes and dropping when looking up)    Lower Extremity Assessment Lower Extremity Assessment: Overall WFL for tasks assessed (testing WFL, however functionally balance per patient not at baseline)  Cervical / Trunk Assessment Cervical / Trunk Assessment: Normal  Communication   Communication Communication: No apparent difficulties    Cognition Arousal: Alert Behavior During Therapy: WFL for tasks assessed/performed   PT - Cognitive impairments: No apparent impairments                         Following commands: Intact       Cueing       General Comments      Exercises Other Exercises Other Exercises: Educated patient on  BEFAST for s/s of stroke   Assessment/Plan    PT Assessment Patient does not need any further PT services  PT Problem List         PT Treatment Interventions      PT Goals (Current goals can be found in the Care Plan section)  Acute Rehab PT Goals Patient Stated Goal: to be back to normal PT Goal Formulation: All assessment and education complete, DC therapy    Frequency       Co-evaluation               AM-PAC PT 6 Clicks Mobility  Outcome Measure Help needed turning from your back to your side while in a flat bed without using bedrails?: None Help needed moving from lying on your back to sitting on the side of a flat bed without using bedrails?: None Help needed moving to and from a bed to a chair (including a wheelchair)?: None Help needed standing up from a chair using your arms (e.g., wheelchair or bedside chair)?: None Help needed to walk in hospital room?: None Help needed climbing 3-5 steps with a railing? : None 6 Click Score: 24    End of Session   Activity Tolerance: Patient tolerated treatment well Patient left: in bed;with call bell/phone within reach;with family/visitor present Nurse Communication: Mobility status PT Visit Diagnosis: Unsteadiness on feet (R26.81)    Time: 9180-9167 PT Time Calculation (min) (ACUTE ONLY): 13 min   Charges:   PT Evaluation $PT Eval Moderate Complexity: 1 Mod   PT General Charges $$ ACUTE PT VISIT: 1 Visit         Maryanne Finder, PT, DPT Physical Therapist - [facility]  [facility]   Narciso Stoutenburg A Kein Carlberg 12/15/2023, 8:41 AM

## 2023-12-15 NOTE — Plan of Care (Signed)
  Problem: Education: Goal: Knowledge of disease or condition will improve 12/15/2023 1140 by Kamaya Keckler L, RN Outcome: Progressing 12/15/2023 1128 by Chas Axel L, RN Outcome: Progressing 12/15/2023 1128 by Delmore Sear L, RN Outcome: Progressing Goal: Knowledge of secondary prevention will improve (MUST DOCUMENT ALL) Outcome: Progressing Goal: Knowledge of patient specific risk factors will improve (DELETE if not current risk factor) 12/15/2023 1140 by Nahum Sherrer L, RN Outcome: Progressing 12/15/2023 1128 by Cuyler Vandyken L, RN Outcome: Progressing 12/15/2023 1128 by Halyn Flaugher L, RN Outcome: Progressing   Problem: Coping: Goal: Will verbalize positive feelings about self 12/15/2023 1140 by Lorance Pickeral L, RN Outcome: Progressing 12/15/2023 1128 by Toney Lizaola L, RN Outcome: Progressing 12/15/2023 1128 by Tery Hoeger L, RN Outcome: Progressing   Problem: Self-Care: Goal: Ability to participate in self-care as condition permits will improve Outcome: Progressing   Problem: Education: Goal: Knowledge of General Education information will improve Description: Including pain rating scale, medication(s)/side effects and non-pharmacologic comfort measures 12/15/2023 1140 by Natashia Roseman L, RN Outcome: Progressing 12/15/2023 1128 by Merla Sawka L, RN Outcome: Progressing   Problem: Health Behavior/Discharge Planning: Goal: Ability to manage health-related needs will improve Outcome: Progressing   Problem: Activity: Goal: Risk for activity intolerance will decrease Outcome: Progressing

## 2023-12-15 NOTE — Hospital Course (Signed)
 52yo with h/o HTN, HLD, tobacco use, migraines, and class 2 obesity who presented on 8/18 with R-sided numbness.  MRI with small acute infarct in posterior limb of internal capsule.  Neurology is consulting.

## 2023-12-15 NOTE — Progress Notes (Signed)
 SLP Cancellation Note  Patient Details Name: Jenna Flowers MRN: [id number] DOB: [birth year]   Cancelled treatment:       Reason Eval/Treat Not Completed: SLP screened, no needs identified, will sign off (chart reviewed; consulted NSG and met w/ pt/Husband in room while eating her breakfast meal.)   Pt is a 53 y/o female who presented to ED on 12/14/23 for R sided weakness and numbness; feeling off balance. 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 PMH: HTN, tobacco abuse, obesity, migraines. No speech nor swallowing issues reported in the ED.  Upon talking w/ pt, pt denied any difficulty swallowing and is currently on a regular diet; po meds per NSG. Pt conversed in Full conversation w/out expressive/receptive deficits noted; pt denied any speech-language deficits. Speech clear. No further skilled ST services indicated as pt appears at her communication baseline. Encouraged pt to eat her breakfast meal for nutrition/energy. Pt agreed. NSG to reconsult if any change in status while admitted.      Comer Portugal, MS, CCC-SLP Speech Language Pathologist Rehab Services; [facility] [phone] (ascom) Tylor Courtwright 12/15/2023, 10:49 AM

## 2023-12-15 NOTE — Progress Notes (Signed)
..      [facility] REFERRAL        Occupational Therapy * Physical Therapy * Speech Therapy                           DATE 12/15/23  PATIENT NAME : Jenna Flowers  PATIENT MRN : [id number]       DIAGNOSIS/DIAGNOSIS CODE stroke I63.9   DATE OF DISCHARGE: 8/19       PRIMARY CARE PHYSICIAN      PCP PHONE/FAX Norleen Rower   [phone]       Dear Provider (Name: [facility] outpatient __  Fax: [phone]   I certify that I have examined this patient and that occupational/physical/speech therapy is necessary on an outpatient basis.    The patient has expressed interest in completing their recommended course of therapy at your  location.  Once a formal order from the patient's primary care physician has been obtained, please  contact him/her to schedule an appointment for evaluation at your earliest convenience.   [ x]  Physical Therapy Evaluate and Treat  [  x]  Occupational Therapy Evaluate and Treat  [  ]  Speech Therapy Evaluate and Treat         The patient's primary care physician (listed above) must furnish and be responsible for a formal order such that the recommended services may be furnished while under the primary physician's care, and that the plan of care will be established and reviewed every 30 days (or more often if condition necessitates).

## 2023-12-15 NOTE — Progress Notes (Signed)
 NEUROLOGY CONSULT FOLLOW UP NOTE   Date of service: December 15, 2023 Patient Name: Jenna Flowers MRN:  [id number] DOB:  08/16/1970  Interval Hx/subjective  MRI brain completed overnight-small lacunar infarct in the left internal capsule/thalamus. Seen and examined this morning.  No acute complaints.  Vitals   Vitals:   12/14/23 2357 12/14/23 2359 12/15/23 0357 12/15/23 0757  BP: (!) 186/91  (!) 194/108 (!) 155/92  Pulse: 64  67 65  Resp: 18   (!) 24  Temp: 97.8 F (36.6 C)  97.6 F (36.4 C) 97.6 F (36.4 C)  TempSrc:   Oral Oral  SpO2: 100%  98% 96%  Weight:  85.7 kg    Height:  5' (1.524 m)       Body mass index is 36.9 kg/m.  Physical Exam  General: Awake alert in no distress HEENT: Normocephalic atraumatic Lungs: Clear Cardiovascular: Regular rate rhythm Neurological exam Awake alert oriented x 3.  No dysarthria.  No aphasia. Cranial nerves II to XII intact Motor examination with no drift in any of the 4 extremities Sensation intact to light touch without extinction on double simultaneous stimulation Coordination examinations reveal no dysmetria. Gait testing deferred at this time  Medications  Current Facility-Administered Medications:     stroke: early stages of recovery book, , Does not apply, Once, Jenna Flowers, Xilin, MD   0.9 %  sodium chloride  infusion, , Intravenous, Continuous, Jenna Flowers, Xilin, MD, Last Rate: 40 mL/hr at 12/15/23 0436, Infusion Verify at 12/15/23 0436   acetaminophen  (TYLENOL ) tablet 650 mg, 650 mg, Oral, Q4H PRN **OR** acetaminophen  (TYLENOL ) 160 MG/5ML solution 650 mg, 650 mg, Per Tube, Q4H PRN **OR** acetaminophen  (TYLENOL ) suppository 650 mg, 650 mg, Rectal, Q4H PRN, Jenna Flowers, Xilin, MD   aspirin  suppository 300 mg, 300 mg, Rectal, Daily **OR** aspirin  tablet 325 mg, 325 mg, Oral, Daily, Jenna Flowers, Xilin, MD   enoxaparin  (LOVENOX ) injection 40 mg, 40 mg, Subcutaneous, Q24H, Jenna Flowers, Jenna Flowers, Jenna Flowers   hydrALAZINE  (APRESOLINE ) injection 5 mg, 5 mg, Intravenous,  Q2H PRN, Jenna Flowers, Xilin, MD   nicotine  (NICODERM CQ  - dosed in mg/24 hours) patch 21 mg, 21 mg, Transdermal, Daily, Jenna Flowers, Xilin, MD   ondansetron  (ZOFRAN ) injection 4 mg, 4 mg, Intravenous, Q8H PRN, Jenna Flowers, Xilin, MD   rosuvastatin  (CRESTOR ) tablet 20 mg, 20 mg, Oral, Daily, Jenna Flowers, Xilin, MD   senna-docusate (Senokot-S) tablet 1 tablet, 1 tablet, Oral, QHS PRN, Jenna Rankins, MD  Labs and Diagnostic Imaging   CBC:  Recent Labs  Lab 12/14/23 1827  WBC 7.7  NEUTROABS 3.9  HGB 13.2  HCT 39.4  MCV 86.4  PLT 245    Basic Metabolic Panel:  Lab Results  Component Value Date   NA 135 12/14/2023   K 3.8 12/14/2023   CO2 24 12/14/2023   GLUCOSE 114 (H) 12/14/2023   BUN 8 12/14/2023   CREATININE 0.74 12/14/2023   CALCIUM  9.6 12/14/2023   GFRNONAA >60 12/14/2023   GFRAA >60 05/03/2015   Lipid Panel:  Lab Results  Component Value Date   LDLCALC 68 12/15/2023   HgbA1c:  Lab Results  Component Value Date   HGBA1C 6.2 (H) 12/14/2023   Urine Drug Screen:     Component Value Date/Time   LABOPIA NONE DETECTED 12/15/2023 0409   COCAINSCRNUR NONE DETECTED 12/15/2023 0409   LABBENZ NONE DETECTED 12/15/2023 0409   AMPHETMU NONE DETECTED 12/15/2023 0409   THCU NONE DETECTED 12/15/2023 0409   LABBARB NONE DETECTED 12/15/2023 0409    Alcohol Level  Component Value Date/Time   [facility] <15 12/14/2023 1827   INR  Lab Results  Component Value Date   INR 1.0 12/14/2023   APTT  Lab Results  Component Value Date   APTT 32 12/14/2023   2D echocardiogram-LVEF 70 to 75%, borderline concentric LVH, grade 2 diastolic dysfunction, normal left atrial size, normal aortic and mitral valve with trivial mitral regurgitation  CT Head without contrast(Personally reviewed): Aspects 10.  No bleed  CT angio Head and Neck with contrast(Personally reviewed): No ELVO  MRI Brain(Personally reviewed): Lacunar infarct involving the left thalamic capsular junction   Assessment   Jenna Flowers is a  53 y.o. female has history of hypertension, hyperlipidemia, tobacco abuse presented to ED for sudden onset of right-sided numbness and weakness with symptoms resolving at the time of arrival.  Seen as telestroke yesterday by myself.  Too mild to treat symptoms from an IV thrombolysis perspective.  No ELVO signs on exam.  Eventually the CT angio head and neck revealed no ELVO. MRI brain showed a lacunar infarction in the left thalamic capsular junction. Etiology likely small vessel disease  Impression: Acute ischemic infarction-etiology small vessel disease  Recommendations  Frequent neurochecks Telemetry Dual antiplatelet treatment with aspirin  81+ Plavix  75 for 3 weeks followed by aspirin  81 only Continue home statin-LDL at goal. A1c goal less than 7.  Management per primary team. Therapy assessments Blood pressure-can start normalizing blood pressure in a day or so with goal blood pressure of normotension. Counseled the patient on smoking cessation-she verbalizes understanding Outpatient neurology follow-up in 8 to 12 weeks-can follow with [facility] clinic neurology. Plan discussed with patient. Plan discussed with hospitalist Dr. Barbarann  ______________________________________________________________________   Signed, Eligio Lav, MD [facility]

## 2024-02-01 ENCOUNTER — Other Ambulatory Visit: Payer: Self-pay

## 2024-02-01 ENCOUNTER — Observation Stay
Admission: EM | Admit: 2024-02-01 | Discharge: 2024-02-03 | DRG: 439 | Disposition: A | Discharge status: Home / Self Care | Attending: Student | Admitting: Student

## 2024-02-01 ENCOUNTER — Emergency Department

## 2024-02-01 ENCOUNTER — Encounter: Payer: Self-pay | Admitting: Internal Medicine

## 2024-02-01 DIAGNOSIS — Z8673 Personal history of transient ischemic attack (TIA), and cerebral infarction without residual deficits: Secondary | ICD-10-CM | POA: Diagnosis not present

## 2024-02-01 DIAGNOSIS — F17210 Nicotine dependence, cigarettes, uncomplicated: Secondary | ICD-10-CM | POA: Diagnosis not present

## 2024-02-01 DIAGNOSIS — E66812 Obesity, class 2: Secondary | ICD-10-CM | POA: Diagnosis present

## 2024-02-01 DIAGNOSIS — Z9049 Acquired absence of other specified parts of digestive tract: Secondary | ICD-10-CM

## 2024-02-01 DIAGNOSIS — Z6835 Body mass index (BMI) 35.0-35.9, adult: Secondary | ICD-10-CM | POA: Diagnosis not present

## 2024-02-01 DIAGNOSIS — Z881 Allergy status to other antibiotic agents status: Secondary | ICD-10-CM | POA: Diagnosis not present

## 2024-02-01 DIAGNOSIS — I110 Hypertensive heart disease with heart failure: Secondary | ICD-10-CM | POA: Diagnosis present

## 2024-02-01 DIAGNOSIS — Z713 Dietary counseling and surveillance: Secondary | ICD-10-CM

## 2024-02-01 DIAGNOSIS — I5032 Chronic diastolic (congestive) heart failure: Secondary | ICD-10-CM | POA: Diagnosis not present

## 2024-02-01 DIAGNOSIS — I10 Essential (primary) hypertension: Secondary | ICD-10-CM | POA: Diagnosis present

## 2024-02-01 DIAGNOSIS — Z880 Allergy status to penicillin: Secondary | ICD-10-CM

## 2024-02-01 DIAGNOSIS — Z716 Tobacco abuse counseling: Secondary | ICD-10-CM

## 2024-02-01 DIAGNOSIS — E785 Hyperlipidemia, unspecified: Secondary | ICD-10-CM | POA: Diagnosis present

## 2024-02-01 DIAGNOSIS — Z885 Allergy status to narcotic agent status: Secondary | ICD-10-CM

## 2024-02-01 DIAGNOSIS — Z7982 Long term (current) use of aspirin: Secondary | ICD-10-CM

## 2024-02-01 DIAGNOSIS — E669 Obesity, unspecified: Secondary | ICD-10-CM | POA: Diagnosis present

## 2024-02-01 DIAGNOSIS — Z833 Family history of diabetes mellitus: Secondary | ICD-10-CM | POA: Diagnosis not present

## 2024-02-01 DIAGNOSIS — I639 Cerebral infarction, unspecified: Secondary | ICD-10-CM | POA: Diagnosis present

## 2024-02-01 DIAGNOSIS — K861 Other chronic pancreatitis: Secondary | ICD-10-CM | POA: Diagnosis present

## 2024-02-01 DIAGNOSIS — F17200 Nicotine dependence, unspecified, uncomplicated: Secondary | ICD-10-CM | POA: Diagnosis present

## 2024-02-01 DIAGNOSIS — K859 Acute pancreatitis without necrosis or infection, unspecified: Principal | ICD-10-CM | POA: Diagnosis present

## 2024-02-01 DIAGNOSIS — Z3201 Encounter for pregnancy test, result positive: Secondary | ICD-10-CM

## 2024-02-01 LAB — CBC
HCT: 41.6 % (ref 36.0–46.0)
Hemoglobin: 13.7 g/dL (ref 12.0–15.0)
MCH: 28.9 pg (ref 26.0–34.0)
MCHC: 32.9 g/dL (ref 30.0–36.0)
MCV: 87.8 fL (ref 80.0–100.0)
Platelets: 289 K/uL (ref 150–400)
RBC: 4.74 MIL/uL (ref 3.87–5.11)
RDW: 13.9 % (ref 11.5–15.5)
WBC: 14.1 K/uL — ABNORMAL HIGH (ref 4.0–10.5)
nRBC: 0 % (ref 0.0–0.2)

## 2024-02-01 LAB — COMPREHENSIVE METABOLIC PANEL WITH GFR
ALT: 12 U/L (ref 0–44)
AST: 14 U/L — ABNORMAL LOW (ref 15–41)
Albumin: 4.1 g/dL (ref 3.5–5.0)
Alkaline Phosphatase: 86 U/L (ref 38–126)
Anion gap: 12 (ref 5–15)
BUN: 7 mg/dL (ref 6–20)
CO2: 24 mmol/L (ref 22–32)
Calcium: 9.4 mg/dL (ref 8.9–10.3)
Chloride: 103 mmol/L (ref 98–111)
Creatinine, Ser: 0.67 mg/dL (ref 0.44–1.00)
GFR, Estimated: >60 mL/min (ref >60)
Glucose, Bld: 116 mg/dL — ABNORMAL HIGH (ref 70–99)
Potassium: 3.9 mmol/L (ref 3.5–5.1)
Sodium: 139 mmol/L (ref 135–145)
Total Bilirubin: 0.9 mg/dL (ref 0.0–1.2)
Total Protein: 7.8 g/dL (ref 6.5–8.1)

## 2024-02-01 LAB — URINALYSIS, ROUTINE W REFLEX MICROSCOPIC
Bilirubin Urine: NEGATIVE
Glucose, UA: NEGATIVE mg/dL
Ketones, ur: 20 mg/dL — AB
Nitrite: NEGATIVE
Protein, ur: 30 mg/dL — AB
Specific Gravity, Urine: 1.021 (ref 1.005–1.030)
pH: 6 (ref 5.0–8.0)

## 2024-02-01 LAB — TRIGLYCERIDES: Triglycerides: 149 mg/dL (ref <150)

## 2024-02-01 LAB — HCG, QUANTITATIVE, PREGNANCY: hCG, Beta Chain, Quant, S: 12 m[IU]/mL — ABNORMAL HIGH (ref <5)

## 2024-02-01 LAB — BRAIN NATRIURETIC PEPTIDE: B Natriuretic Peptide: 22.8 pg/mL (ref 0.0–100.0)

## 2024-02-01 LAB — PREGNANCY, URINE: Preg Test, Ur: POSITIVE — AB

## 2024-02-01 LAB — LIPASE, BLOOD: Lipase: 172 U/L — ABNORMAL HIGH (ref 11–51)

## 2024-02-01 MED ORDER — SODIUM CHLORIDE 0.9 % IV SOLN: INTRAVENOUS | Status: AC

## 2024-02-01 MED ORDER — NICOTINE 21 MG/24HR TD PT24
21.0000 mg | MEDICATED_PATCH | Freq: Every day | TRANSDERMAL | Status: DC
  Filled 2024-02-01: qty 1

## 2024-02-01 MED ORDER — ROSUVASTATIN CALCIUM 10 MG PO TABS
5.0000 mg | ORAL_TABLET | Freq: Every day | ORAL | Status: DC
  Administered 2024-02-02 – 2024-02-03 (×2): 5 mg via ORAL
  Filled 2024-02-01 (×2): qty 1

## 2024-02-01 MED ORDER — SODIUM CHLORIDE 0.9 % IV BOLUS
1000.0000 mL | Freq: Once | INTRAVENOUS | Status: AC
  Administered 2024-02-01: 1000 mL via INTRAVENOUS

## 2024-02-01 MED ORDER — ASPIRIN 81 MG PO TBEC
81.0000 mg | DELAYED_RELEASE_TABLET | Freq: Every day | ORAL | Status: DC
  Administered 2024-02-02 – 2024-02-03 (×2): 81 mg via ORAL
  Filled 2024-02-01 (×2): qty 1

## 2024-02-01 MED ORDER — MORPHINE SULFATE (PF) 2 MG/ML IV SOLN
2.0000 mg | INTRAVENOUS | Status: DC | PRN
  Administered 2024-02-01: 2 mg via INTRAVENOUS
  Filled 2024-02-01: qty 1

## 2024-02-01 MED ORDER — LOSARTAN POTASSIUM 50 MG PO TABS
100.0000 mg | ORAL_TABLET | Freq: Every day | ORAL | Status: DC
Start: 2024-02-02 — End: 2024-02-01

## 2024-02-01 MED ORDER — OXYCODONE-ACETAMINOPHEN 5-325 MG PO TABS: 1.0000 | ORAL_TABLET | ORAL | Status: DC | PRN

## 2024-02-01 MED ORDER — MORPHINE SULFATE (PF) 4 MG/ML IV SOLN
4.0000 mg | Freq: Once | INTRAVENOUS | Status: AC
  Administered 2024-02-01: 4 mg via INTRAVENOUS
  Filled 2024-02-01: qty 1

## 2024-02-01 MED ORDER — ENOXAPARIN SODIUM 40 MG/0.4ML IJ SOSY
40.0000 mg | PREFILLED_SYRINGE | INTRAMUSCULAR | Status: DC
  Administered 2024-02-01 – 2024-02-02 (×2): 40 mg via SUBCUTANEOUS
  Filled 2024-02-01 (×2): qty 0.4

## 2024-02-01 MED ORDER — ACETAMINOPHEN 325 MG PO TABS
650.0000 mg | ORAL_TABLET | Freq: Four times a day (QID) | ORAL | Status: DC | PRN
  Administered 2024-02-03: 650 mg via ORAL
  Filled 2024-02-01: qty 2

## 2024-02-01 MED ORDER — IOHEXOL 300 MG/ML  SOLN
100.0000 mL | Freq: Once | INTRAMUSCULAR | Status: AC | PRN
Start: 2024-02-01 — End: 2024-02-01
  Administered 2024-02-01: 100 mL via INTRAVENOUS

## 2024-02-01 MED ORDER — ONDANSETRON HCL 4 MG/2ML IJ SOLN
4.0000 mg | Freq: Once | INTRAMUSCULAR | Status: AC
  Administered 2024-02-01: 4 mg via INTRAVENOUS
  Filled 2024-02-01: qty 2

## 2024-02-01 MED ORDER — HYDRALAZINE HCL 20 MG/ML IJ SOLN: 5.0000 mg | INTRAMUSCULAR | Status: DC | PRN

## 2024-02-01 MED ORDER — ONDANSETRON HCL 4 MG/2ML IJ SOLN: 4.0000 mg | Freq: Three times a day (TID) | INTRAMUSCULAR | Status: DC | PRN

## 2024-02-01 NOTE — ED Triage Notes (Addendum)
 Pt to ED via POV from home. Pt reports abdominal pain for the last few days. Pt states she believes it is pancreatitis.

## 2024-02-01 NOTE — ED Provider Notes (Signed)
 [facility] Emergency Department Provider Note     Event Date/Time   First MD Initiated Contact with Patient 02/01/24 1539     (approximate)   History   Abdominal Pain   HPI  Jenna Flowers is a 53 y.o. female with a history of obesity, CVA, HTN, HLD, and cholecystectomy with known pancreatic duct stone.  She presents to the ED endorsing epigastric abdominal pain for the last few days.  She notes pain with referral to the mid back.  She reports anorexia and poor p.o. intake as well as intermittent nausea and vomiting.  She denies any frank fevers, chills, or sweats but she does report loose stools.  Patient reports symptoms are similar to her previous episodes of pancreatitis.  Physical Exam   Triage Vital Signs: ED Triage Vitals  Encounter Vitals Group     BP 02/01/24 1228 (!) 136/95     Girls Systolic BP Percentile --      Girls Diastolic BP Percentile --      Boys Systolic BP Percentile --      Boys Diastolic BP Percentile --      Pulse Rate 02/01/24 1228 (!) 109     Resp 02/01/24 1228 18     Temp 02/01/24 1228 99.4 F (37.4 C)     Temp Source 02/01/24 1228 Oral     SpO2 02/01/24 1228 96 %     Weight 02/01/24 1231 180 lb (81.6 kg)     Height 02/01/24 1231 5' (1.524 m)     Head Circumference --      Peak Flow --      Pain Score 02/01/24 1231 10     Pain Loc --      Pain Education --      Exclude from Growth Chart --     Most recent vital signs: Vitals:   02/01/24 1542 02/01/24 1725  BP:  126/81  Pulse:  90  Resp:  16  Temp:  99.4 F (37.4 C)  SpO2: 96% 96%    General Awake, no distress. NAD HEENT NCAT. PERRL. EOMI. No rhinorrhea. Mucous membranes are moist.  CV:  Good peripheral perfusion. RRR RESP:  Normal effort. CTA ABD:  Mild distention.  Mildly tender to palpation to the epigastrium.  Normoactive bowel sounds x 4.  ED Results / Procedures / Treatments   Labs (all labs ordered are listed, but only abnormal  results are displayed) Labs Reviewed  LIPASE, BLOOD - Abnormal; Notable for the following components:      Result Value   Lipase 172 (*)    All other components within normal limits  COMPREHENSIVE METABOLIC PANEL WITH GFR - Abnormal; Notable for the following components:   Glucose, Bld 116 (*)    AST 14 (*)    All other components within normal limits  CBC - Abnormal; Notable for the following components:   WBC 14.1 (*)    All other components within normal limits  URINALYSIS, ROUTINE W REFLEX MICROSCOPIC - Abnormal; Notable for the following components:   Color, Urine AMBER (*)    APPearance HAZY (*)    Hgb urine dipstick SMALL (*)    Ketones, ur 20 (*)    Protein, ur 30 (*)    Leukocytes,Ua SMALL (*)    Bacteria, UA RARE (*)    All other components within normal limits    EKG   RADIOLOGY  I personally viewed and evaluated these images as part of my  medical decision making, as well as reviewing the written report by the radiologist.  ED Provider Interpretation: Acute on chronic pancreatitis with peripancreatic edema noted  CT ABDOMEN PELVIS W CONTRAST Result Date: 02/01/2024 CLINICAL DATA:  Acute severe pancreatitis. Abdominal pain for a few days. EXAM: CT ABDOMEN AND PELVIS WITH CONTRAST TECHNIQUE: Multidetector CT imaging of the abdomen and pelvis was performed using the standard protocol following bolus administration of intravenous contrast. RADIATION DOSE REDUCTION: This exam was performed according to the departmental dose-optimization program which includes automated exposure control, adjustment of the mA and/or kV according to patient size and/or use of iterative reconstruction technique. CONTRAST:  OMNIPAQUE  IOHEXOL  300 MG/ML  SOLN COMPARISON:  03/16/2020 FINDINGS: Lower chest: Subpleural fibrosis and ground-glass changes in the lungs likely representing interstitial lung disease although edema could also have this appearance in the appropriate clinical setting.  Hepatobiliary: No focal liver lesions. Gallbladder is surgically absent. No bile duct dilatation. Pancreas: Diffuse pancreatic parenchymal atrophy with pancreatic calcifications consistent with underlying chronic pancreatitis. There is stranding and edema around the head and body of the pancreas, extending up into the porta hepatis, and along the right anterior pararenal space. This is consistent with acute pancreatitis. No loculated collection is identified. Spleen: Normal in size without focal abnormality. Adrenals/Urinary Tract: Unchanged appearance of 2 cm diameter left adrenal gland nodule. No imaging follow-up is indicated. Kidneys are symmetrical with homogeneous nephrograms. No hydronephrosis or hydroureter. Bladder is normal. Stomach/Bowel: Stomach, small bowel, and colon are not abnormally distended. No wall thickening or inflammatory changes. Appendix is not identified. Vascular/Lymphatic: Aortic atherosclerosis. No enlarged abdominal or pelvic lymph nodes. Scattered retroperitoneal lymph nodes without pathologic enlargement, likely reactive. Reproductive: Uterus and bilateral adnexa are unremarkable. Other: No abdominal wall hernia or abnormality. No abdominopelvic ascites. Musculoskeletal: No acute or significant osseous findings. IMPRESSION: 1. Described changes of acute on chronic pancreatitis with prominent peripancreatic edema around the head and body region. No loculated collections. 2. Subpleural fibrosis and ground-glass changes in the lung bases likely representing interstitial lung disease although edema could also have this appearance. 3. Aortic atherosclerosis. Electronically Signed   By: Elsie Gravely M.D.   On: 02/01/2024 17:36    PROCEDURES:  Critical Care performed: No  Procedures   MEDICATIONS ORDERED IN ED: Medications  morphine  (PF) 4 MG/ML injection 4 mg (4 mg Intravenous Given 02/01/24 1639)  ondansetron  (ZOFRAN ) injection 4 mg (4 mg Intravenous Given 02/01/24 1638)   iohexol  (OMNIPAQUE ) 300 MG/ML solution 100 mL (100 mLs Intravenous Contrast Given 02/01/24 1654)  sodium chloride  0.9 % bolus 1,000 mL (1,000 mLs Intravenous New Bag/Given 02/01/24 1839)     IMPRESSION / MDM / ASSESSMENT AND PLAN / ED COURSE  I reviewed the triage vital signs and the nursing notes.                              Differential diagnosis includes, but is not limited to, biliary disease (biliary colic, acute cholecystitis, cholangitis, choledocholithiasis, etc), intrathoracic causes for epigastric abdominal pain including ACS, gastritis, duodenitis, pancreatitis, small bowel or large bowel obstruction, abdominal aortic aneurysm, hernia, and ulcer(s).  Patient's presentation is most consistent with acute presentation with potential threat to life or bodily function.  Patient's diagnosis is consistent with acute on chronic pancreatitis.  Patient with a history of the same, presents endorsing epigastric pain with referral to the mid back as well as anorexia, nausea, vomiting, and loose stools.  She denies any  frank fevers, chills, sweats, or chest pain.  Labs showed elevated white count of 14 as well as a lipase elevated at 170.  Remaining labs unremarkable.  Patient with IV pain meds noting some improvement of her symptoms.  Fluid bolus will be started and patient will be invited to the hospital for management of her acute on chronic pancreatitis.  She is agreeable to the plan of admission at this time.    ----------------------------------------- 6:40 PM on 02/01/2024 ----------------------------------------- Dr. Hilma will admit the patient to the hospital service for ongoing medical management.  Clinical Course as of 02/01/24 1840  Mon Feb 01, 2024  1601 Elevated WBCs (14) lipase (172) concerning for acute pancreatitis [JM]  1750 CT abdomen and pelvis with contrast, shows acute on chronic pancreatitis as evidenced by focal Peripancreatic edema. [JM]  1800 Interim exam shows patient  resting comfortably noting improvement in her pain after IV pain medications.  She has been advised of the CT scan findings.  She is agreeable to the plan of admission for ongoing pain control. [JM]    Clinical Course User Index [JM] Bedie Dominey, Candida LULLA Kings, PA-C    FINAL CLINICAL IMPRESSION(S) / ED DIAGNOSES   Final diagnoses:  Acute on chronic pancreatitis (HCC)     Rx / DC Orders   ED Discharge Orders     None        Note:  This document was prepared using Dragon voice recognition software and may include unintentional dictation errors.    Loyd Candida LULLA Kings, PA-C 02/01/24 1840    Levander Slate, MD 02/01/24 [phone]

## 2024-02-01 NOTE — H&P (Signed)
 History and Physical    Jenna Flowers FMW:[id number] DOB: 15-Jul-1970 DOA: 02/01/2024  Referring MD/NP/PA:   PCP: Rudolpho Norleen JONETTA, MD   Patient coming from:  The patient is coming from home.     Chief Complaint: abdominal pain  HPI: Jenna Flowers is a 53 y.o. female with medical history significant of gallstone, s/p of cholecystectomy, pancreatic duct stone, pancreatitis, hypertension, hyperlipidemia, diastolic CHF, stroke, obesity, who presents with abdominal pain.  Patient states that she has abdominal pain for more than 3 days.  It is located in the upper abdomen, constant, severe, radiating to the back, not aggravated or alleviated by any known factors.  Patient has poor appetite and decreased oral intake.  Denies nausea, vomiting, diarrhea.  No fever or chills.  Denies chest pain, cough, SOB.  Patient states that she has pancreatic ductal stone.  She had ERCP procedure at [facility] in the past, but was told that her stone is too big and difficult to be removed by ERCP procedure.  If her symptoms get worse, she will need Whipple procedure.  Patient states that she does not want to do the Whipple procedure which is too invasive for her. Data reviewed independently and ED Course: pt was found to have WBC 14.1, lipase 122, normal liver function.  Temperature 99.4, blood pressure 126/81, heart rate of 109, RR 18, oxygen saturation 96% on room air.  Patient is placed in telemetry bed for outpatient.  CT of abdomen/pelvis: 1. Described changes of acute on chronic pancreatitis with prominent peripancreatic edema around the head and body region. No loculated collections. 2. Subpleural fibrosis and ground-glass changes in the lung bases likely representing interstitial lung disease although edema could also have this appearance. 3. Aortic atherosclerosis. 4.  No focal liver lesions. Gallbladder is surgically absent. No bile duct dilatation.   EKG: I have personally reviewed.  Sinus  rhythm, QTc 417, borderline left axis deviation.   Review of Systems:   General: no fevers, chills, no body weight gain, has poor appetite, has fatigue HEENT: no blurry vision, hearing changes or sore throat Respiratory: no dyspnea, coughing, wheezing CV: no chest pain, no palpitations GI: no nausea, vomiting, has abdominal pain,no  diarrhea, constipation GU: no dysuria, burning on urination, increased urinary frequency, hematuria  Ext: no leg edema Neuro: no unilateral weakness, numbness, or tingling, no vision change or hearing loss Skin: no rash, no skin tear. MSK: No muscle spasm, no deformity, no limitation of range of movement in spin Heme: No easy bruising.  Travel history: No recent long distant travel.   Allergy:  Allergies  Allergen Reactions   Amoxicillin     Other reaction(s): Other (See Comments) Hypotension, n/v; ALL PENICILLINS   Codeine Shortness Of Breath   Codeine Sulfate Shortness Of Breath   Penicillins Diarrhea and Nausea And Vomiting    Past Medical History:  Diagnosis Date   HLD (hyperlipidemia)    Hypertension    Obesity    Smoker     Past Surgical History:  Procedure Laterality Date   CHOLECYSTECTOMY      Social History:  reports that she has been smoking cigarettes. She started smoking about 30 years ago. She has a 30.8 pack-year smoking history. She has never used smokeless tobacco. She reports that she does not drink alcohol and does not use drugs.  Family History:  Family History  Problem Relation Age of Onset   Diabetes Mother    Diabetes Father    Breast cancer Neg Hx  Prior to Admission medications   Medication Sig Start Date End Date Taking? Authorizing Provider  acetaminophen  (TYLENOL ) 500 MG tablet Take 1,000 mg by mouth every 6 (six) hours as needed for mild pain (pain score 1-3) or moderate pain (pain score 4-6).    [provider]  aspirin  EC 81 MG tablet Take 1 tablet (81 mg total) by mouth daily. Swallow  whole. 12/15/23   Barbarann Nest, MD  losartan (COZAAR) 100 MG tablet Take 100 mg by mouth daily. 10/27/23 10/26/24  [provider]  Multiple Vitamins-Minerals (ALIVE MULTI-VITAMIN PO) Take 1 tablet by mouth daily.    [provider]  nicotine  (NICODERM CQ  - DOSED IN MG/24 HOURS) 21 mg/24hr patch Place 1 patch (21 mg total) onto the skin daily. 12/16/23   Barbarann Nest, MD  rosuvastatin  (CRESTOR ) 5 MG tablet Take 5 mg by mouth daily.    [provider]    Physical Exam: Vitals:   02/01/24 1231 02/01/24 1542 02/01/24 1725 02/01/24 2132  BP:   126/81 128/83  Pulse:   90 88  Resp:   16 18  Temp:   99.4 F (37.4 C) 99.2 F (37.3 C)  TempSrc:   Oral   SpO2:  96% 96% 94%  Weight: 81.6 kg     Height: 5' (1.524 m)      General: Not in acute distress HEENT:       Eyes: PERRL, EOMI, no jaundice       ENT: No discharge from the ears and nose, no pharynx injection, no tonsillar enlargement.        Neck: No JVD, no bruit, no mass felt. Heme: No neck lymph node enlargement. Cardiac: S1/S2, RRR, No murmurs, No gallops or rubs. Respiratory: No rales, wheezing, rhonchi or rubs. GI: Soft, nondistended, has upper abdominal tenderness, no rebound pain, no organomegaly, BS present. GU: No hematuria Ext: No pitting leg edema bilaterally. 1+DP/PT pulse bilaterally. Musculoskeletal: No joint deformities, No joint redness or warmth, no limitation of ROM in spin. Skin: No rashes.  Neuro: Alert, oriented X3, cranial nerves II-XII grossly intact, moves all extremities normally. Psych: Patient is not psychotic, no suicidal or hemocidal ideation.  Labs on Admission: I have personally reviewed following labs and imaging studies  CBC: Recent Labs  Lab 02/01/24 1233  WBC 14.1*  HGB 13.7  HCT 41.6  MCV 87.8  PLT 289   Basic Metabolic Panel: Recent Labs  Lab 02/01/24 1233  NA 139  K 3.9  CL 103  CO2 24  GLUCOSE 116*  BUN 7  CREATININE 0.67  CALCIUM  9.4    GFR: Estimated Creatinine Clearance: 76 mL/min (by C-G formula based on SCr of 0.67 mg/dL). Liver Function Tests: Recent Labs  Lab 02/01/24 1233  AST 14*  ALT 12  ALKPHOS 86  BILITOT 0.9  PROT 7.8  ALBUMIN 4.1   Recent Labs  Lab 02/01/24 1233  LIPASE 172*   No results for input(s): AMMONIA in the last 168 hours. Coagulation Profile: No results for input(s): INR, PROTIME in the last 168 hours. Cardiac Enzymes: No results for input(s): CKTOTAL, CKMB, CKMBINDEX, TROPONINI in the last 168 hours. BNP (last 3 results) No results for input(s): PROBNP in the last 8760 hours. HbA1C: No results for input(s): HGBA1C in the last 72 hours. CBG: No results for input(s): GLUCAP in the last 168 hours. Lipid Profile: Recent Labs    02/01/24 1233  TRIG 149   Thyroid Function Tests: No results for input(s): TSH, T4TOTAL, FREET4,  T3FREE, THYROIDAB in the last 72 hours. Anemia Panel: No results for input(s): VITAMINB12, FOLATE, FERRITIN, TIBC, IRON, RETICCTPCT in the last 72 hours. Urine analysis:    Component Value Date/Time   COLORURINE AMBER (A) 02/01/2024 1233   APPEARANCEUR HAZY (A) 02/01/2024 1233   APPEARANCEUR Clear 10/31/2012 1253   LABSPEC 1.021 02/01/2024 1233   LABSPEC 1.011 10/31/2012 1253   PHURINE 6.0 02/01/2024 1233   GLUCOSEU NEGATIVE 02/01/2024 1233   GLUCOSEU Negative 10/31/2012 1253   HGBUR SMALL (A) 02/01/2024 1233   BILIRUBINUR NEGATIVE 02/01/2024 1233   BILIRUBINUR Negative 10/31/2012 1253   KETONESUR 20 (A) 02/01/2024 1233   PROTEINUR 30 (A) 02/01/2024 1233   NITRITE NEGATIVE 02/01/2024 1233   LEUKOCYTESUR SMALL (A) 02/01/2024 1233   LEUKOCYTESUR Negative 10/31/2012 1253   Sepsis Labs: @LABRCNTIP (procalcitonin:4,lacticidven:4) )No results found for this or any previous visit (from the past 240 hours).   Radiological Exams on Admission:   Assessment/Plan Principal Problem:   Pancreatitis Active  Problems:   Stroke (HCC)   HLD (hyperlipidemia)   Hypertension   Chronic diastolic CHF (congestive heart failure) (HCC)   Positive urine pregnancy test   Smoker   Obesity   Assessment and Plan:  Pancreatitis: this is recurrent pancreatitis, possibly related to pancreatic duct stone.  Per patient, she will not do Whipple procedure as told before, therefore I will not order MRCP today.  Patient would like to have conservative treatment.  Her lipase is 172.  Liver function normal with normal total bilirubin 0.9.  -Placed in telemetry bed for observation - Pain control: As needed morphine , Percocet, Tylenol  - As needed Zofran  - IV fluid: 1 L normal saline, Naima 75 cc/h - check TG level --> 149  Stroke (HCC) -Aspirin , Crestor   HLD (hyperlipidemia) -Aspirin , Crestor   Hypertension -Cozaar - IV hydralazine  as needed  Chronic diastolic CHF (congestive heart failure) (HCC): 2D echo on 12/05/2023 showed EF of 70-75% with grade 2 diastolic dysfunction.  Patient does not have leg edema or JVD.  CHF seem to be compensated. - Check BNP  Positive urine pregnancy test: Patient has positive urine pregnancy test.  Patient already received CT scan of abdomen/pelvis in ED. -Check repeat hCG --> 12 - start prenatal vitamin  Smoker -Nicotine  patch  Obesity: Patient has Obesity Class II, with body weight 81.6 Kg and BMI 35,15  kg/m2.  - Encourage losing weight - Exercise and healthy diet     DVT ppx:  SQ Lovenox   Code Status: Full code   Family Communication:     not done, no family member is at bed side.      Disposition Plan:  Anticipate discharge back to previous environment  Consults called:  none   Admission status and Level of care: Telemetry Medical:    for obs     Dispo: The patient is from: Home              Anticipated d/c is to: Home              Anticipated d/c date is: 1 day              Patient currently is not medically stable to d/c.    Severity of  Illness:  The appropriate patient status for this patient is OBSERVATION. Observation status is judged to be reasonable and necessary in order to provide the required intensity of service to ensure the patient's safety. The patient's presenting symptoms, physical exam findings, and initial radiographic and laboratory data in  the context of their medical condition is felt to place them at decreased risk for further clinical deterioration. Furthermore, it is anticipated that the patient will be medically stable for discharge from the hospital within 2 midnights of admission.        Date of Service 02/02/2024    Caleb Exon [facility]   If 7PM-7AM, please contact night-coverage [email or web address] 02/02/2024, 1:53 AM

## 2024-02-02 DIAGNOSIS — K859 Acute pancreatitis without necrosis or infection, unspecified: Secondary | ICD-10-CM | POA: Diagnosis present

## 2024-02-02 LAB — BASIC METABOLIC PANEL WITH GFR
Anion gap: 10 (ref 5–15)
BUN: 11 mg/dL (ref 6–20)
CO2: 22 mmol/L (ref 22–32)
Calcium: 8.6 mg/dL — ABNORMAL LOW (ref 8.9–10.3)
Chloride: 106 mmol/L (ref 98–111)
Creatinine, Ser: 0.72 mg/dL (ref 0.44–1.00)
GFR, Estimated: >60 mL/min (ref >60)
Glucose, Bld: 72 mg/dL (ref 70–99)
Potassium: 3.7 mmol/L (ref 3.5–5.1)
Sodium: 138 mmol/L (ref 135–145)

## 2024-02-02 LAB — CBC
HCT: 37 % (ref 36.0–46.0)
Hemoglobin: 12.1 g/dL (ref 12.0–15.0)
MCH: 28.8 pg (ref 26.0–34.0)
MCHC: 32.7 g/dL (ref 30.0–36.0)
MCV: 88.1 fL (ref 80.0–100.0)
Platelets: 239 K/uL (ref 150–400)
RBC: 4.2 MIL/uL (ref 3.87–5.11)
RDW: 14.3 % (ref 11.5–15.5)
WBC: 10.5 K/uL (ref 4.0–10.5)
nRBC: 0 % (ref 0.0–0.2)

## 2024-02-02 LAB — GLUCOSE, CAPILLARY: Glucose-Capillary: 71 mg/dL (ref 70–99)

## 2024-02-02 LAB — HCG, QUANTITATIVE, PREGNANCY: hCG, Beta Chain, Quant, S: 10 m[IU]/mL — ABNORMAL HIGH (ref <5)

## 2024-02-02 MED ORDER — PRENATAL MULTIVITAMIN CH
1.0000 | ORAL_TABLET | Freq: Every day | ORAL | Status: DC
  Filled 2024-02-02: qty 1

## 2024-02-02 MED ORDER — ORAL CARE MOUTH RINSE
15.0000 mL | OROMUCOSAL | Status: DC | PRN
Start: 2024-02-02 — End: 2024-02-03

## 2024-02-02 MED ORDER — LOSARTAN POTASSIUM 50 MG PO TABS
100.0000 mg | ORAL_TABLET | Freq: Every day | ORAL | Status: DC
  Administered 2024-02-02 – 2024-02-03 (×2): 100 mg via ORAL
  Filled 2024-02-02 (×2): qty 2

## 2024-02-02 NOTE — TOC CM/SW Note (Signed)
 Transition of Care [facility]) CM/SW Note    Transition of Care [facility]) - Inpatient Brief Assessment   Patient Details  Name: Jenna Flowers MRN: [id number] Date of Birth: Oct 08, 1970  [facility]) CM/SW Contact:    Alfonso Rummer, LCSW Phone Number: 02/02/2024, 1:23 PM   Clinical Narrative: KEN DELENA Rummer completed TOC chart review No toc needs identified please contact TOC should needs arise.    Transition of Care Asessment: Insurance and Status: Insurance coverage has been reviewed Patient has primary care physician: Yes (JOHNSTON, JOHN D) Home environment has been reviewed: single family home   Prior/Current Home Services: Current home services Social Drivers of Health Review: SDOH reviewed no interventions necessary Readmission risk has been reviewed: No Transition of care needs: no transition of care needs at this time

## 2024-02-02 NOTE — Progress Notes (Signed)
 PROGRESS NOTE   HPI was taken from Dr. Hilma: Jenna Flowers is a 53 y.o. female with medical history significant of gallstone, s/p of cholecystectomy, pancreatic duct stone, pancreatitis, hypertension, hyperlipidemia, diastolic CHF, stroke, obesity, who presents with abdominal pain.   Patient states that she has abdominal pain for more than 3 days.  It is located in the upper abdomen, constant, severe, radiating to the back, not aggravated or alleviated by any known factors.  Patient has poor appetite and decreased oral intake.  Denies nausea, vomiting, diarrhea.  No fever or chills.  Denies chest pain, cough, SOB.   Patient states that she has pancreatic ductal stone.  She had ERCP procedure at [facility] in the past, but was told that her stone is too big and difficult to be removed by ERCP procedure.  If her symptoms get worse, she will need Whipple procedure.  Patient states that she does not want to do the Whipple procedure which is too invasive for her. Data reviewed independently and ED Course: pt was found to have WBC 14.1, lipase 122, normal liver function.  Temperature 99.4, blood pressure 126/81, heart rate of 109, RR 18, oxygen saturation 96% on room air.  Patient is placed in telemetry bed for outpatient.   CT of abdomen/pelvis: 1. Described changes of acute on chronic pancreatitis with prominent peripancreatic edema around the head and body region. No loculated collections. 2. Subpleural fibrosis and ground-glass changes in the lung bases likely representing interstitial lung disease although edema could also have this appearance. 3. Aortic atherosclerosis. 4.  No focal liver lesions. Gallbladder is surgically absent. No bile duct dilatation.   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 Will not do Whipple procedure as told before so will continue w/ conservative treatment. Continue on IVFs. Diet advanced to clear liquid diet.    Hx of CVA: continue on aspirin ,statin    HLD: continue on statin    HTN: continue on losartan. IV hydralazine  prn    Chronic diastolic RYQ:zryn on 12/05/2023 showed EF of 70-75% with grade II diastolic dysfunction. Appears compensated. Monitor I/Os   Positive urine pregnancy test: etiology unclear. Patient already received CT scan of abdomen/pelvis in ED. Repeat bHCG was 12 on 10/6 and bHCG was 10 on 10/7 and would repeat bHCG tomorrow to make sure level continues to trend down. Uterus was unremarkable on CT scan. Has not had a menstrual period in over 5 years as per pt.    Smoker: received smoking cessation counseling x 5 mins. Nicotine  patch to help prevent w/drawl  Obesity: BMI 34.7. Would benefit from weight loss         DVT prophylaxis: lovenox   Code Status: full  Family Communication:  Disposition Plan: likely d/c back home  Level of care: Telemetry Medical  Status is: Inpatient Remains inpatient appropriate because: severity of illness    Consultants:    Procedures:   Antimicrobials:   Subjective: Pt c/o epigastric abd pain  Objective: Vitals:   02/01/24 1725 02/01/24 2132 02/02/24 0333 02/02/24 0716  BP: 126/81 128/83 101/62 109/68  Pulse: 90 88 77  75  Resp: 16 18 18 16   Temp: 99.4 F (37.4 C) 99.2 F (37.3 C) 98 F (36.7 C) 98.5 F (36.9 C)  TempSrc: Oral     SpO2: 96% 94% 93% 92%  Weight:   80.8 kg   Height:        Intake/Output Summary (Last 24 hours) at 02/02/2024 0853 Last data filed at 02/02/2024 9371 Gross per 24 hour  Intake 639.69 ml  Output --  Net 639.69 ml   Filed Weights    02/01/24 1231 02/02/24 0333  Weight: 81.6 kg 80.8 kg    Examination:  General exam: Appears calm and comfortable  Respiratory system: Clear to auscultation. Respiratory effort normal. Cardiovascular system: S1 & S2+. No rubs, gallops or clicks.  Gastrointestinal system: Abdomen is obese, soft and tenderness to palpation. Hypoactive bowel sounds heard. Central nervous system: Alert and oriented. Moves all extremities Psychiatry: Judgement and insight appear normal. Mood & affect appropriate.     Data Reviewed: I have personally reviewed following labs and imaging studies  CBC: Recent Labs  Lab 02/01/24 1233 02/02/24 0444  WBC 14.1* 10.5  HGB 13.7 12.1  HCT 41.6 37.0  MCV 87.8 88.1  PLT 289 239   Basic Metabolic Panel: Recent Labs  Lab 02/01/24 1233 02/02/24 0444  NA 139 138  K 3.9 3.7  CL 103 106  CO2 24 22  GLUCOSE 116* 72  BUN 7 11  CREATININE 0.67 0.72  CALCIUM  9.4 8.6*   GFR: Estimated Creatinine Clearance: 75.6 mL/min (by C-G formula based on SCr of 0.72 mg/dL). Liver Function Tests: Recent Labs  Lab 02/01/24 1233  AST 14*  ALT 12  ALKPHOS 86  BILITOT 0.9  PROT 7.8  ALBUMIN 4.1   Recent Labs  Lab 02/01/24 1233  LIPASE 172*   No results for input(s): AMMONIA in the last 168 hours. Coagulation Profile: No results for input(s): INR, PROTIME in the last 168 hours. Cardiac Enzymes: No results for input(s): CKTOTAL, CKMB, CKMBINDEX, TROPONINI in the last 168 hours. BNP (last 3 results) No results for input(s): PROBNP in the last 8760 hours. HbA1C: No results for input(s): HGBA1C in the last 72 hours. CBG: Recent Labs  Lab 02/02/24 0758  GLUCAP 71   Lipid Profile: Recent Labs    02/01/24 1233  TRIG 149   Thyroid Function Tests: No results for input(s): TSH, T4TOTAL, FREET4, T3FREE, THYROIDAB in the last 72 hours. Anemia Panel: No results for input(s): VITAMINB12, FOLATE, FERRITIN, TIBC, IRON,  RETICCTPCT in the last 72 hours. Sepsis Labs: No results for input(s): PROCALCITON, LATICACIDVEN in the last 168 hours.  No results found for this or any previous visit (from the past 240 hours).       Radiology Studies: CT ABDOMEN PELVIS W CONTRAST Result Date: 02/01/2024 CLINICAL DATA:  Acute severe pancreatitis. Abdominal pain for a few days. EXAM: CT ABDOMEN AND PELVIS WITH CONTRAST TECHNIQUE: Multidetector CT imaging of the abdomen and pelvis was performed using the standard protocol following bolus administration of intravenous contrast. RADIATION DOSE REDUCTION: This exam was performed according to the departmental dose-optimization program which includes automated exposure control, adjustment of the mA and/or kV according to patient size and/or use of iterative reconstruction technique. CONTRAST:  OMNIPAQUE  IOHEXOL  300 MG/ML  SOLN COMPARISON:  03/16/2020 FINDINGS: Lower chest: Subpleural fibrosis and ground-glass changes in the lungs likely representing interstitial lung disease although edema could also have this appearance in the appropriate clinical setting. Hepatobiliary: No focal liver lesions. Gallbladder is surgically  absent. No bile duct dilatation. Pancreas: Diffuse pancreatic parenchymal atrophy with pancreatic calcifications consistent with underlying chronic pancreatitis. There is stranding and edema around the head and body of the pancreas, extending up into the porta hepatis, and along the right anterior pararenal space. This is consistent with acute pancreatitis. No loculated collection is identified. Spleen: Normal in size without focal abnormality. Adrenals/Urinary Tract: Unchanged appearance of 2 cm diameter left adrenal gland nodule. No imaging follow-up is indicated. Kidneys are symmetrical with homogeneous nephrograms. No hydronephrosis or hydroureter. Bladder is normal. Stomach/Bowel: Stomach, small bowel, and colon are not abnormally distended. No wall  thickening or inflammatory changes. Appendix is not identified. Vascular/Lymphatic: Aortic atherosclerosis. No enlarged abdominal or pelvic lymph nodes. Scattered retroperitoneal lymph nodes without pathologic enlargement, likely reactive. Reproductive: Uterus and bilateral adnexa are unremarkable. Other: No abdominal wall hernia or abnormality. No abdominopelvic ascites. Musculoskeletal: No acute or significant osseous findings. IMPRESSION: 1. Described changes of acute on chronic pancreatitis with prominent peripancreatic edema around the head and body region. No loculated collections. 2. Subpleural fibrosis and ground-glass changes in the lung bases likely representing interstitial lung disease although edema could also have this appearance. 3. Aortic atherosclerosis. Electronically Signed   By: Elsie Gravely M.D.   On: 02/01/2024 17:36        Scheduled Meds:  aspirin  EC  81 mg Oral Daily   enoxaparin  (LOVENOX ) injection  40 mg Subcutaneous Q24H   nicotine   21 mg Transdermal Daily   prenatal multivitamin  1 tablet Oral Q1200   rosuvastatin   5 mg Oral Daily   Continuous Infusions:  sodium chloride  75 mL/hr at 02/02/24 9371     LOS: 0 days      Anthony CHRISTELLA Pouch, MD [facility] Pager [phone]  If 7PM-7AM, please contact night-coverage [email or web address] 02/02/2024, 8:53 AM

## 2024-02-02 NOTE — Plan of Care (Signed)

## 2024-02-03 DIAGNOSIS — K859 Acute pancreatitis without necrosis or infection, unspecified: Secondary | ICD-10-CM | POA: Diagnosis not present

## 2024-02-03 LAB — BASIC METABOLIC PANEL WITH GFR
Anion gap: 8 (ref 5–15)
BUN: 10 mg/dL (ref 6–20)
CO2: 24 mmol/L (ref 22–32)
Calcium: 8.7 mg/dL — ABNORMAL LOW (ref 8.9–10.3)
Chloride: 107 mmol/L (ref 98–111)
Creatinine, Ser: 0.5 mg/dL (ref 0.44–1.00)
GFR, Estimated: >60 mL/min (ref >60)
Glucose, Bld: 106 mg/dL — ABNORMAL HIGH (ref 70–99)
Potassium: 3.6 mmol/L (ref 3.5–5.1)
Sodium: 139 mmol/L (ref 135–145)

## 2024-02-03 LAB — VITAMIN B12: Vitamin B-12: 813 pg/mL (ref 180–914)

## 2024-02-03 LAB — CBC
HCT: 35.9 % — ABNORMAL LOW (ref 36.0–46.0)
Hemoglobin: 11.7 g/dL — ABNORMAL LOW (ref 12.0–15.0)
MCH: 28.7 pg (ref 26.0–34.0)
MCHC: 32.6 g/dL (ref 30.0–36.0)
MCV: 88.2 fL (ref 80.0–100.0)
Platelets: 244 K/uL (ref 150–400)
RBC: 4.07 MIL/uL (ref 3.87–5.11)
RDW: 13.7 % (ref 11.5–15.5)
WBC: 7 K/uL (ref 4.0–10.5)
nRBC: 0 % (ref 0.0–0.2)

## 2024-02-03 LAB — MAGNESIUM: Magnesium: 2.1 mg/dL (ref 1.7–2.4)

## 2024-02-03 LAB — PHOSPHORUS: Phosphorus: 3.3 mg/dL (ref 2.5–4.6)

## 2024-02-03 LAB — GLUCOSE, CAPILLARY: Glucose-Capillary: 114 mg/dL — ABNORMAL HIGH (ref 70–99)

## 2024-02-03 LAB — FOLATE: Folate: 8.3 ng/mL (ref >5.9)

## 2024-02-03 LAB — IRON AND TIBC
Iron: 28 ug/dL (ref 28–170)
Saturation Ratios: 10 % — ABNORMAL LOW (ref 10.4–31.8)
TIBC: 286 ug/dL (ref 250–450)
UIBC: 258 ug/dL

## 2024-02-03 LAB — LIPASE, BLOOD: Lipase: 18 U/L (ref 11–51)

## 2024-02-03 MED ORDER — POLYSACCHARIDE IRON COMPLEX 150 MG PO CAPS: 150.0000 mg | ORAL_CAPSULE | Freq: Every day | ORAL | 0 refills | Status: AC

## 2024-02-03 MED ORDER — FOLIC ACID 1 MG PO TABS
1.0000 mg | ORAL_TABLET | Freq: Every day | ORAL | 2 refills | Status: DC
Start: 2024-02-03 — End: 2024-02-03

## 2024-02-03 MED ORDER — ASCORBIC ACID 500 MG PO TABS
500.0000 mg | ORAL_TABLET | Freq: Every day | ORAL | 2 refills | Status: DC
Start: 2024-02-03 — End: 2024-02-03

## 2024-02-03 MED ORDER — POLYSACCHARIDE IRON COMPLEX 150 MG PO CAPS: 150.0000 mg | ORAL_CAPSULE | Freq: Every day | ORAL | 2 refills | Status: DC

## 2024-02-03 MED ORDER — FOLIC ACID 1 MG PO TABS: 1.0000 mg | ORAL_TABLET | Freq: Every day | ORAL | 0 refills | Status: AC

## 2024-02-03 MED ORDER — ASCORBIC ACID 500 MG PO TABS: 500.0000 mg | ORAL_TABLET | Freq: Every day | ORAL | 0 refills | Status: AC

## 2024-02-03 NOTE — Plan of Care (Signed)
   Problem: Education: Goal: Knowledge of General Education information will improve Description: Including pain rating scale, medication(s)/side effects and non-pharmacologic comfort measures Outcome: Progressing   Problem: Clinical Measurements: Goal: Ability to maintain clinical measurements within normal limits will improve Outcome: Progressing Goal: Will remain free from infection Outcome: Progressing

## 2024-02-03 NOTE — Discharge Summary (Signed)
 [facility] Discharge Summary   Patient: Jenna Flowers FMW:[id number]  PCP: Jenna Flowers, Jenna Flowers  Date of admission: 02/01/2024   Date of discharge: 02/03/2024     Discharge Diagnoses:  Principal Problem:   Pancreatitis Active Problems:   Stroke (HCC)   HLD (hyperlipidemia)   Hypertension   Chronic diastolic CHF (congestive heart failure) (HCC)   Positive urine pregnancy test   Smoker   Obesity   Acute pancreatitis   Admitted From: Home Disposition:  Home   Recommendations for Outpatient Follow-up:  Follow-up with PCP in 1 week CT scan for lung cancer screening as an outpatient Started iron supplement, Tsat 10%, repeat iron profile after 3 to 6 months.  Folic acid 8.3, started supplement to prevent deficiency.  Follow-up on pending B12 and vitamin D level. Follow up LABS/TEST: As above   Follow-up Information     Jenna Flowers, Jenna Flowers Follow up on 02/09/2024.   Specialty: Internal Medicine Why: Go at  8:30am. Contact information: [address] [facility] [place] [address] [phone]                Diet recommendation: Cardiac diet  Activity: The patient is advised to gradually reintroduce usual activities, as tolerated  Discharge Condition: stable  Code Status: Full code   History of present illness: As per the H and P dictated on admission.  Hospital Course:    HPI was taken from Dr. Hilma: Jenna Flowers is a 53 y.o. female with medical history significant of gallstone, s/p of cholecystectomy, pancreatic duct stone, pancreatitis, hypertension, hyperlipidemia, diastolic CHF, stroke, obesity, who presents with abdominal pain.   Patient states that she has abdominal pain for more than 3 days.  It is located in the upper abdomen, constant, severe, radiating to the back, not aggravated or alleviated by any known factors.  Patient has poor appetite and decreased oral intake.  Denies nausea, vomiting, diarrhea.  No fever or chills.   Denies chest pain, cough, SOB.   Patient states that she has pancreatic ductal stone.  She had ERCP procedure at [facility] in the past, but was told that her stone is too big and difficult to be removed by ERCP procedure.  If her symptoms get worse, she will need Whipple procedure.  Patient states that she does not want to do the Whipple procedure which is too invasive for her. Data reviewed independently and ED Course: pt was found to have WBC 14.1, lipase 122, normal liver function.  Temperature 99.4, blood pressure 126/81, heart rate of 109, RR 18, oxygen saturation 96% on room air.  Patient is placed in telemetry bed for outpatient.   CT of abdomen/pelvis: 1. Described changes of acute on chronic pancreatitis with prominent peripancreatic edema around the head and body region. No loculated collections. 2. Subpleural fibrosis and ground-glass changes in the lung bases likely representing interstitial lung disease although edema could also have this appearance. 3. Aortic atherosclerosis. 4.  No focal liver lesions. Gallbladder is surgically absent. No bile duct dilatation.   Assessment & Plan:   # Pancreatitis: this is recurrent pancreatitis, possibly related to pancreatic duct stone. Will not do Whipple procedure as told before so will continue w/ conservative treatment. S/p IVFs. Diet advanced gradually and she tolerated well.  Currently patient denies any abdominal pain, no nausea vomiting or diarrhea.  Clinically stable, medically optimized and patient agreed for discharge planning today.  # Hx of CVA: continue on aspirin ,statin    # HLD:  continue on statin    # HTN: continue on losartan.   # Chronic diastolic RYQ:zryn on 12/05/2023 showed EF of 70-75% with grade II diastolic dysfunction. Appears compensated. Monitor I/Os   # Positive urine pregnancy test: etiology unclear. Patient already received CT scan of abdomen/pelvis in ED. Repeat bHCG was 12 on 10/6 and bHCG was 10 on 10/7 and  would repeat bHCG tomorrow to make sure level continues to trend down. Uterus was unremarkable on CT scan. Has not had a menstrual period in over 5 years as per pt. patient was recommended to follow with PCP and GYN as an outpatient.   # Smoker: received smoking cessation counseling x 5 mins. Nicotine  patch to help prevent w/drawl  # Iron deficiency, Tsat 10%, started oral iron supplement with vitamin C.  Follow-up with PCP to repeat iron profile after 3 to 6 months. B12 is within normal range Folic acid 8.3 at lower end, started folic acid 1 mg p.o. daily.  Follow with PCP for further management of the note patient.    Body mass index is 34.62 kg/m.  Nutrition Interventions:  - Patient was instructed, not to drive, operate heavy machinery, perform activities at heights, swimming or participation in water activities or provide baby sitting services while on Pain, Sleep and Anxiety Medications; until her outpatient Physician has advised to do so again.  - Also recommended to not to take more than prescribed Pain, Sleep and Anxiety Medications.  Patient was ambulatory without any assistance.  On the day of the discharge the patient's vitals were stable, and no other acute medical condition were reported by patient. the patient was felt safe to be discharge at Home.  Consultants: None Procedures: None  Discharge Exam: General: Appear in no distress, Oral Mucosa Clear, moist. Cardiovascular: S1 and S2 Present, no Murmur, Respiratory: normal respiratory effort, Bilateral Air entry present and no Crackles, no wheezes Abdomen: Bowel Sound present, Soft and no tenderness. Extremities: no Pedal edema, no calf tenderness Neurology: alert and oriented to time, place, and person affect appropriate.  Filed Weights   02/01/24 1231 02/02/24 0333 02/03/24 0335  Weight: 81.6 kg 80.8 kg 80.4 kg   Vitals:   02/03/24 0335 02/03/24 0839  BP: (!) 143/80 (!) 147/83  Pulse: 67 63  Resp: 18 16  Temp:  98.3 F (36.8 C) 98.6 F (37 C)  SpO2: 96% 95%    DISCHARGE MEDICATION: Allergies as of 02/03/2024       Reactions   Amoxicillin    Other reaction(s): Other (See Comments) Hypotension, n/v; ALL PENICILLINS   Codeine Shortness Of Breath   Codeine Sulfate Shortness Of Breath   Penicillins Diarrhea, Nausea And Vomiting        Medication List     TAKE these medications    acetaminophen  500 MG tablet Commonly known as: TYLENOL  Take 1,000 mg by mouth every 6 (six) hours as needed for mild pain (pain score 1-3) or moderate pain (pain score 4-6).   ALIVE MULTI-VITAMIN PO Take 1 tablet by mouth daily.   ascorbic acid 500 MG tablet Commonly known as: VITAMIN C Take 1 tablet (500 mg total) by mouth daily.   aspirin  EC 81 MG tablet Take 1 tablet (81 mg total) by mouth daily. Swallow whole.   folic acid 1 MG tablet Commonly known as: FOLVITE Take 1 tablet (1 mg total) by mouth daily.   iron polysaccharides 150 MG capsule Commonly known as: NIFEREX Take 1 capsule (150 mg total) by mouth daily.  losartan 100 MG tablet Commonly known as: COZAAR Take 100 mg by mouth daily.   nicotine  21 mg/24hr patch Commonly known as: NICODERM CQ  - dosed in mg/24 hours Place 1 patch (21 mg total) onto the skin daily.   rosuvastatin  5 MG tablet Commonly known as: CRESTOR  Take 5 mg by mouth daily.       Allergies  Allergen Reactions   Amoxicillin     Other reaction(s): Other (See Comments) Hypotension, n/v; ALL PENICILLINS   Codeine Shortness Of Breath   Codeine Sulfate Shortness Of Breath   Penicillins Diarrhea and Nausea And Vomiting   Discharge Instructions     Call Jenna Flowers for:  extreme fatigue   Complete by: As directed    Call Jenna Flowers for:  persistant dizziness or light-headedness   Complete by: As directed    Call Jenna Flowers for:  persistant nausea and vomiting   Complete by: As directed    Call Jenna Flowers for:  redness, tenderness, or signs of infection (pain, swelling, redness, odor or  green/yellow discharge around incision site)   Complete by: As directed    Call Jenna Flowers for:  temperature >100.4   Complete by: As directed    Diet - low sodium heart healthy   Complete by: As directed    Discharge instructions   Complete by: As directed    Follow-up with PCP in 1 week CT scan for lung cancer screening as an outpatient Started iron supplement, Tsat 10%, repeat iron profile after 3 to 6 months.  Folic acid 8.3, started supplement to prevent deficiency.  Follow-up on pending B12 and vitamin D level.   Increase activity slowly   Complete by: As directed        The results of significant diagnostics from this hospitalization (including imaging, microbiology, ancillary and laboratory) are listed below for reference.    Significant Diagnostic Studies: CT ABDOMEN PELVIS W CONTRAST Result Date: 02/01/2024 CLINICAL DATA:  Acute severe pancreatitis. Abdominal pain for a few days. EXAM: CT ABDOMEN AND PELVIS WITH CONTRAST TECHNIQUE: Multidetector CT imaging of the abdomen and pelvis was performed using the standard protocol following bolus administration of intravenous contrast. RADIATION DOSE REDUCTION: This exam was performed according to the departmental dose-optimization program which includes automated exposure control, adjustment of the mA and/or kV according to patient size and/or use of iterative reconstruction technique. CONTRAST:  OMNIPAQUE  IOHEXOL  300 MG/ML  SOLN COMPARISON:  03/16/2020 FINDINGS: Lower chest: Subpleural fibrosis and ground-glass changes in the lungs likely representing interstitial lung disease although edema could also have this appearance in the appropriate clinical setting. Hepatobiliary: No focal liver lesions. Gallbladder is surgically absent. No bile duct dilatation. Pancreas: Diffuse pancreatic parenchymal atrophy with pancreatic calcifications consistent with underlying chronic pancreatitis. There is stranding and edema around the head and body of the  pancreas, extending up into the porta hepatis, and along the right anterior pararenal space. This is consistent with acute pancreatitis. No loculated collection is identified. Spleen: Normal in size without focal abnormality. Adrenals/Urinary Tract: Unchanged appearance of 2 cm diameter left adrenal gland nodule. No imaging follow-up is indicated. Kidneys are symmetrical with homogeneous nephrograms. No hydronephrosis or hydroureter. Bladder is normal. Stomach/Bowel: Stomach, small bowel, and colon are not abnormally distended. No wall thickening or inflammatory changes. Appendix is not identified. Vascular/Lymphatic: Aortic atherosclerosis. No enlarged abdominal or pelvic lymph nodes. Scattered retroperitoneal lymph nodes without pathologic enlargement, likely reactive. Reproductive: Uterus and bilateral adnexa are unremarkable. Other: No abdominal wall hernia or abnormality. No abdominopelvic ascites. Musculoskeletal:  No acute or significant osseous findings. IMPRESSION: 1. Described changes of acute on chronic pancreatitis with prominent peripancreatic edema around the head and body region. No loculated collections. 2. Subpleural fibrosis and ground-glass changes in the lung bases likely representing interstitial lung disease although edema could also have this appearance. 3. Aortic atherosclerosis. Electronically Signed   By: Elsie Gravely M.D.   On: 02/01/2024 17:36    Microbiology: No results found for this or any previous visit (from the past 240 hours).   Labs: CBC: Recent Labs  Lab 02/01/24 1233 02/02/24 0444 02/03/24 0443  WBC 14.1* 10.5 7.0  HGB 13.7 12.1 11.7*  HCT 41.6 37.0 35.9*  MCV 87.8 88.1 88.2  PLT 289 239 244   Basic Metabolic Panel: Recent Labs  Lab 02/01/24 1233 02/02/24 0444 02/03/24 0443  NA 139 138 139  K 3.9 3.7 3.6  CL 103 106 107  CO2 24 22 24   GLUCOSE 116* 72 106*  BUN 7 11 10   CREATININE 0.67 0.72 0.50  CALCIUM  9.4 8.6* 8.7*  MG  --   --  2.1  PHOS   --   --  3.3   Liver Function Tests: Recent Labs  Lab 02/01/24 1233  AST 14*  ALT 12  ALKPHOS 86  BILITOT 0.9  PROT 7.8  ALBUMIN 4.1   Recent Labs  Lab 02/01/24 1233 02/03/24 0443  LIPASE 172* 18   No results for input(s): AMMONIA in the last 168 hours. Cardiac Enzymes: No results for input(s): CKTOTAL, CKMB, CKMBINDEX, TROPONINI in the last 168 hours. BNP (last 3 results) Recent Labs    02/01/24 1233  BNP 22.8   CBG: Recent Labs  Lab 02/02/24 0758 02/03/24 0842  GLUCAP 71 114*    Time spent: 35 minutes  Signed:  Elvan Sor  [facility] 02/03/2024 2:10 PM

## 2024-02-03 NOTE — Progress Notes (Signed)
 Discharge instructions reviewed with the patient. Patient sent down to the discharge lounge to wait on her ride

## 2024-02-04 LAB — MISC LABCORP TEST (SEND OUT): Labcorp test code: 81950

## 2024-02-04 LAB — BETA HCG QUANT (REF LAB): hCG Quant: 8 m[IU]/mL

## 2024-02-17 ENCOUNTER — Encounter: Payer: Self-pay | Admitting: *Deleted

## 2024-02-17 ENCOUNTER — Ambulatory Visit: Admitting: Nurse Practitioner

## 2024-02-17 ENCOUNTER — Encounter: Payer: Self-pay | Admitting: Nurse Practitioner

## 2024-02-17 ENCOUNTER — Other Ambulatory Visit: Payer: Self-pay | Admitting: Nurse Practitioner

## 2024-02-17 VITALS — BP 148/98 | HR 87 | Temp 98.7°F | Ht 59.0 in | Wt 184.0 lb

## 2024-02-17 DIAGNOSIS — I5032 Chronic diastolic (congestive) heart failure: Secondary | ICD-10-CM

## 2024-02-17 DIAGNOSIS — Z6837 Body mass index (BMI) 37.0-37.9, adult: Secondary | ICD-10-CM

## 2024-02-17 DIAGNOSIS — I10 Essential (primary) hypertension: Secondary | ICD-10-CM

## 2024-02-17 DIAGNOSIS — F17200 Nicotine dependence, unspecified, uncomplicated: Secondary | ICD-10-CM

## 2024-02-17 DIAGNOSIS — E785 Hyperlipidemia, unspecified: Secondary | ICD-10-CM

## 2024-02-17 DIAGNOSIS — E66812 Obesity, class 2: Secondary | ICD-10-CM

## 2024-02-17 DIAGNOSIS — Z23 Encounter for immunization: Secondary | ICD-10-CM

## 2024-02-17 DIAGNOSIS — Z8673 Personal history of transient ischemic attack (TIA), and cerebral infarction without residual deficits: Secondary | ICD-10-CM | POA: Diagnosis not present

## 2024-02-17 MED ORDER — HYDROCHLOROTHIAZIDE 12.5 MG PO TABS: 12.5000 mg | ORAL_TABLET | Freq: Every day | ORAL | 0 refills | Status: DC

## 2024-02-17 NOTE — Progress Notes (Signed)
 BP (!) 148/98   Pulse 87   Temp 98.7 F (37.1 C)   Ht 4' 11 (1.499 m)   Wt 184 lb (83.5 kg)   LMP  (LMP Unknown)   SpO2 96%   BMI 37.16 kg/m    Subjective:    Patient ID: Jenna Flowers, female    DOB: 03-Jun-1970, 53 y.o.   MRN: [id number]  HPI: Jenna Flowers is a 53 y.o. female  Chief Complaint  Patient presents with   Establish Care    Pt c/o bilat knee pain. States she is unable to take meloxicam.    Discussed the use of AI scribe software for clinical note transcription with the patient, who gave verbal consent to proceed.  History of Present Illness Jenna Flowers is a 53 year old female with a history of hyperlipidemia, hypertension, recurrent pancreatitis, and stroke who presents to establish care.  Recurrent pancreatitis - History of recurrent episodes, most recent hospitalization on February 01, 2024, following ingestion of cream with fresh coconut - Currently improved post-hospitalization - Folic acid and iron polysaccharide were initiated during hospitalization -patient having a hard time tolerating it, recommend patient try taking it every other day and if still having issues may drop to 3 times a week.   Cerebrovascular accident (stroke) - Acute stroke in August 2025 at work, presenting with dizziness and leaning to the side - Transported to hospital by her daughter, stroke diagnosed - Neurology follow-up scheduled for November  Hypertension - Current blood pressure 148/100 mmHg - On losartan, increased to 100 mg daily - No other antihypertensive medications used - Perception that previous provider did not adequately address hypertension  Arthralgia and osteoarthritis - Bilateral knee pain and swelling attributed to arthritis - Meloxicam discontinued on neurologist's advice after TIA - Current pain management with Voltaren and knee brace - Persistent pain and swelling despite current regimen  Hyperlipidemia - On rosuvastatin  5 mg  daily Lipid Panel     Component Value Date/Time   CHOL 126 12/15/2023 0437   TRIG 149 02/01/2024 1233   HDL 25 (L) 12/15/2023 0437   CHOLHDL 5.0 12/15/2023 0437   VLDL 33 12/15/2023 0437   LDLCALC 68 12/15/2023 0437     Obesity - Weight 184 pounds - BMI 37.16 Flowsheet Row Office Visit from 02/17/2024 in [facility]  1 45 inches     Psychological symptoms - Difficulty managing large crowds, attributed to overstimulation         02/17/2024    3:13 PM  Depression screen PHQ 2/9  Decreased Interest 0  Down, Depressed, Hopeless 0  PHQ - 2 Score 0  Altered sleeping 0  Tired, decreased energy 2  Change in appetite 0  Feeling bad or failure about yourself  0  Trouble concentrating 0  Moving slowly or fidgety/restless 0  Suicidal thoughts 0  PHQ-9 Score 2  Difficult doing work/chores Not difficult at all    Relevant past medical, surgical, family and social history reviewed and updated as indicated. Interim medical history since our last visit reviewed. Allergies and medications reviewed and updated.  Review of Systems  Ten systems reviewed and is negative except as mentioned in HPI      Objective:      BP (!) 148/98   Pulse 87   Temp 98.7 F (37.1 C)   Ht 4' 11 (1.499 m)   Wt 184 lb (83.5 kg)   LMP  (LMP Unknown)   SpO2 96%  BMI 37.16 kg/m    Wt Readings from Last 3 Encounters:  02/17/24 184 lb (83.5 kg)  02/03/24 177 lb 4 oz (80.4 kg)  12/14/23 188 lb 15 oz (85.7 kg)    Physical Exam VITALS: BP- 148/100 MEASUREMENTS: Weight- 184, BMI- 37.16. GENERAL: Alert, cooperative, well developed, no acute distress HEENT: Normocephalic, normal oropharynx, moist mucous membranes CHEST: Clear to auscultation bilaterally, No wheezes, rhonchi, or crackles CARDIOVASCULAR: Normal heart rate and rhythm, S1 and S2 normal without murmurs ABDOMEN: Soft, non-tender, non-distended, without organomegaly, Normal bowel sounds EXTREMITIES: No  cyanosis or edema NEUROLOGICAL: Cranial nerves grossly intact, Moves all extremities without gross motor or sensory deficit  Results for orders placed or performed during the hospital encounter of 02/01/24  Lipase, blood   Collection Time: 02/01/24 12:33 PM  Result Value Ref Range   Lipase 172 (H) 11 - 51 U/L  Comprehensive metabolic panel   Collection Time: 02/01/24 12:33 PM  Result Value Ref Range   Sodium 139 135 - 145 mmol/L   Potassium 3.9 3.5 - 5.1 mmol/L   Chloride 103 98 - 111 mmol/L   CO2 24 22 - 32 mmol/L   Glucose, Bld 116 (H) 70 - 99 mg/dL   BUN 7 6 - 20 mg/dL   Creatinine, Ser 9.32 0.44 - 1.00 mg/dL   Calcium  9.4 8.9 - 10.3 mg/dL   Total Protein 7.8 6.5 - 8.1 g/dL   Albumin 4.1 3.5 - 5.0 g/dL   AST 14 (L) 15 - 41 U/L   ALT 12 0 - 44 U/L   Alkaline Phosphatase 86 38 - 126 U/L   Total Bilirubin 0.9 0.0 - 1.2 mg/dL   GFR, Estimated >39 >39 mL/min   Anion gap 12 5 - 15  CBC   Collection Time: 02/01/24 12:33 PM  Result Value Ref Range   WBC 14.1 (H) 4.0 - 10.5 K/uL   RBC 4.74 3.87 - 5.11 MIL/uL   Hemoglobin 13.7 12.0 - 15.0 g/dL   HCT 58.3 63.9 - 53.9 %   MCV 87.8 80.0 - 100.0 fL   MCH 28.9 26.0 - 34.0 pg   MCHC 32.9 30.0 - 36.0 g/dL   RDW 86.0 88.4 - 84.4 %   Platelets 289 150 - 400 K/uL   nRBC 0.0 0.0 - 0.2 %  Urinalysis, Routine w reflex microscopic -Urine, Clean Catch   Collection Time: 02/01/24 12:33 PM  Result Value Ref Range   Color, Urine AMBER (A) YELLOW   APPearance HAZY (A) CLEAR   Specific Gravity, Urine 1.021 1.005 - 1.030   pH 6.0 5.0 - 8.0   Glucose, UA NEGATIVE NEGATIVE mg/dL   Hgb urine dipstick SMALL (A) NEGATIVE   Bilirubin Urine NEGATIVE NEGATIVE   Ketones, ur 20 (A) NEGATIVE mg/dL   Protein, ur 30 (A) NEGATIVE mg/dL   Nitrite NEGATIVE NEGATIVE   Leukocytes,Ua SMALL (A) NEGATIVE   RBC / HPF 11-20 0 - 5 RBC/hpf   WBC, UA 11-20 0 - 5 WBC/hpf   Bacteria, UA RARE (A) NONE SEEN   Squamous Epithelial / HPF 11-20 0 - 5 /HPF   Mucus  PRESENT   Brain natriuretic peptide   Collection Time: 02/01/24 12:33 PM  Result Value Ref Range   B Natriuretic Peptide 22.8 0.0 - 100.0 pg/mL  Triglycerides   Collection Time: 02/01/24 12:33 PM  Result Value Ref Range   Triglycerides 149 <150 mg/dL  Pregnancy, urine   Collection Time: 02/01/24 12:33 PM  Result Value Ref Range  Preg Test, Ur POSITIVE (A) NEGATIVE  hCG, quantitative, pregnancy   Collection Time: 02/01/24 12:33 PM  Result Value Ref Range   hCG, Beta Chain, Quant, S 12 (H) <5 mIU/mL  Basic metabolic panel   Collection Time: 02/02/24  4:44 AM  Result Value Ref Range   Sodium 138 135 - 145 mmol/L   Potassium 3.7 3.5 - 5.1 mmol/L   Chloride 106 98 - 111 mmol/L   CO2 22 22 - 32 mmol/L   Glucose, Bld 72 70 - 99 mg/dL   BUN 11 6 - 20 mg/dL   Creatinine, Ser 9.27 0.44 - 1.00 mg/dL   Calcium  8.6 (L) 8.9 - 10.3 mg/dL   GFR, Estimated >39 >39 mL/min   Anion gap 10 5 - 15  CBC   Collection Time: 02/02/24  4:44 AM  Result Value Ref Range   WBC 10.5 4.0 - 10.5 K/uL   RBC 4.20 3.87 - 5.11 MIL/uL   Hemoglobin 12.1 12.0 - 15.0 g/dL   HCT 62.9 63.9 - 53.9 %   MCV 88.1 80.0 - 100.0 fL   MCH 28.8 26.0 - 34.0 pg   MCHC 32.7 30.0 - 36.0 g/dL   RDW 85.6 88.4 - 84.4 %   Platelets 239 150 - 400 K/uL   nRBC 0.0 0.0 - 0.2 %  hCG, quantitative, pregnancy   Collection Time: 02/02/24  4:44 AM  Result Value Ref Range   hCG, Beta Chain, Quant, S 10 (H) <5 mIU/mL  Glucose, capillary   Collection Time: 02/02/24  7:58 AM  Result Value Ref Range   Glucose-Capillary 71 70 - 99 mg/dL  CBC   Collection Time: 02/03/24  4:43 AM  Result Value Ref Range   WBC 7.0 4.0 - 10.5 K/uL   RBC 4.07 3.87 - 5.11 MIL/uL   Hemoglobin 11.7 (L) 12.0 - 15.0 g/dL   HCT 64.0 (L) 63.9 - 53.9 %   MCV 88.2 80.0 - 100.0 fL   MCH 28.7 26.0 - 34.0 pg   MCHC 32.6 30.0 - 36.0 g/dL   RDW 86.2 88.4 - 84.4 %   Platelets 244 150 - 400 K/uL   nRBC 0.0 0.0 - 0.2 %  Basic metabolic panel   Collection Time:  02/03/24  4:43 AM  Result Value Ref Range   Sodium 139 135 - 145 mmol/L   Potassium 3.6 3.5 - 5.1 mmol/L   Chloride 107 98 - 111 mmol/L   CO2 24 22 - 32 mmol/L   Glucose, Bld 106 (H) 70 - 99 mg/dL   BUN 10 6 - 20 mg/dL   Creatinine, Ser 9.49 0.44 - 1.00 mg/dL   Calcium  8.7 (L) 8.9 - 10.3 mg/dL   GFR, Estimated >39 >39 mL/min   Anion gap 8 5 - 15  Lipase, blood   Collection Time: 02/03/24  4:43 AM  Result Value Ref Range   Lipase 18 11 - 51 U/L  Beta hCG quant (ref lab)   Collection Time: 02/03/24  4:43 AM  Result Value Ref Range   hCG Quant 8 mIU/mL  Magnesium   Collection Time: 02/03/24  4:43 AM  Result Value Ref Range   Magnesium 2.1 1.7 - 2.4 mg/dL  Phosphorus   Collection Time: 02/03/24  4:43 AM  Result Value Ref Range   Phosphorus 3.3 2.5 - 4.6 mg/dL  Iron and TIBC   Collection Time: 02/03/24  4:43 AM  Result Value Ref Range   Iron 28 28 - 170 ug/dL   TIBC  286 250 - 450 ug/dL   Saturation Ratios 10 (L) 10.4 - 31.8 %   UIBC 258 ug/dL  Folate   Collection Time: 02/03/24  4:43 AM  Result Value Ref Range   Folate 8.3 >5.9 ng/mL  Glucose, capillary   Collection Time: 02/03/24  8:42 AM  Result Value Ref Range   Glucose-Capillary 114 (H) 70 - 99 mg/dL  Vitamin B12   Collection Time: 02/03/24 10:27 AM  Result Value Ref Range   Vitamin B-12 813 180 - 914 pg/mL          Assessment & Plan:   Problem List Items Addressed This Visit       Cardiovascular and Mediastinum   Hypertension   Relevant Medications   hydrochlorothiazide (HYDRODIURIL) 12.5 MG tablet   RESOLVED: Chronic diastolic CHF (congestive heart failure) (HCC)   Relevant Medications   hydrochlorothiazide (HYDRODIURIL) 12.5 MG tablet     Other   Smoker   Relevant Orders   Ambulatory Referral Lung Cancer Screening [facility]   Obesity   HLD (hyperlipidemia)   Relevant Medications   hydrochlorothiazide (HYDRODIURIL) 12.5 MG tablet   History of CVA (cerebrovascular accident)   Other  Visit Diagnoses       Immunization due    -  Primary   Relevant Orders   Flu vaccine trivalent PF, 6mos and older(Flulaval,Afluria,Fluarix,Fluzone) (Completed)        Assessment and Plan Assessment & Plan Hypertension Hypertension with current blood pressure at 148/100 mmHg, indicating insufficient control with losartan 100 mg daily. - Prescribe hydrochlorothiazide 12.5 mg daily to be taken in the morning to avoid nocturia. - Schedule follow-up appointment in 4 weeks to reassess blood pressure control.  Bilateral knee osteoarthritis Bilateral knee osteoarthritis with persistent pain and swelling. Unable to take meloxicam due to increased cardiovascular risk. - Recommend trial of Tylenol  Arthritis for pain management. - If pain persists, consider referral back to [facility] for further evaluation and possible interventions.  Chronic pancreatitis Chronic pancreatitis with recent hospitalization on February 01, 2024. - Emphasize importance of hospital care for acute exacerbations.  Hx of Stroke Stroke in August 2025 with no recurrent symptoms reported. - Follow-up with neurology is scheduled for November 2025. -currently taking asa 81 mg daily  Iron deficiency, mild Mild iron deficiency with iron saturation slightly below normal. - Recommend taking iron supplement every other day or three times a week to reduce gastrointestinal side effects. - Plan to reassess iron levels after one month of adjusted dosing.  Obesity Obesity with current weight at 184 pounds and BMI of 37.16. - Encourage continuation of lifestyle modifications, including dietary management and regular exercise. -continue to increase physical activity, getting at least 150 min of physical activity a week.  Work on including Runner, broadcasting/film/video 2 days a week.  - continue eating at a calorie deficit 1600-1700 cal a day, eating a well balanced diet with whole foods, avoiding processed foods.   Patient is motivated to  continue working on lifestyle modification.    General Health Maintenance Discussed lung cancer screening and colonoscopy. Last lung cancer screening was in 2013, and colonoscopy records need to be requested. Pap smear was last done in early 2024 or 2023 with a 5-year follow-up interval recommended. - Request records for colonoscopy and Pap smear.        Follow up plan: Return in about 4 weeks (around 03/16/2024) for follow up.

## 2024-02-19 NOTE — Telephone Encounter (Signed)
 Requested medication (s) are due for refill today: na   Requested medication (s) are on the active medication list: yes   Last refill:  02/17/24 #30 0 refills  Future visit scheduled: yes 03/16/24  Notes to clinic:  pharmacy requesting refills for 90 day supply. Do you want to refill for #90?     Requested Prescriptions  Pending Prescriptions Disp Refills   hydrochlorothiazide (HYDRODIURIL) 12.5 MG tablet [Pharmacy Med Name: HYDROCHLOROTHIAZIDE 12.5MG  TABLETS] 90 tablet     Sig: TAKE 1 TABLET(12.5 MG) BY MOUTH DAILY     Cardiovascular: Diuretics - Thiazide Failed - 02/19/2024 12:01 PM      Failed - Last BP in normal range    BP Readings from Last 1 Encounters:  02/17/24 (!) 148/98         Passed - Cr in normal range and within 180 days    Creatinine  Date Value Ref Range Status  11/02/2012 0.81 0.60 - 1.30 mg/dL Final   Creatinine, Ser  Date Value Ref Range Status  02/03/2024 0.50 0.44 - 1.00 mg/dL Final         Passed - K in normal range and within 180 days    Potassium  Date Value Ref Range Status  02/03/2024 3.6 3.5 - 5.1 mmol/L Final  11/02/2012 4.1 3.5 - 5.1 mmol/L Final         Passed - Na in normal range and within 180 days    Sodium  Date Value Ref Range Status  02/03/2024 139 135 - 145 mmol/L Final  11/02/2012 142 136 - 145 mmol/L Final         Passed - Valid encounter within last 6 months    Recent Outpatient Visits           2 days ago Immunization due   [facility] Gareth Mliss [place], [place]

## 2024-03-16 ENCOUNTER — Other Ambulatory Visit: Payer: Self-pay | Admitting: Nurse Practitioner

## 2024-03-16 ENCOUNTER — Encounter: Payer: Self-pay | Admitting: Nurse Practitioner

## 2024-03-16 ENCOUNTER — Ambulatory Visit: Admitting: Nurse Practitioner

## 2024-03-16 ENCOUNTER — Other Ambulatory Visit: Payer: Self-pay | Admitting: *Deleted

## 2024-03-16 ENCOUNTER — Telehealth: Payer: Self-pay | Admitting: *Deleted

## 2024-03-16 VITALS — BP 124/80 | HR 85 | Temp 98.2°F | Ht 59.0 in | Wt 179.0 lb

## 2024-03-16 DIAGNOSIS — Z6837 Body mass index (BMI) 37.0-37.9, adult: Secondary | ICD-10-CM

## 2024-03-16 DIAGNOSIS — I10 Essential (primary) hypertension: Secondary | ICD-10-CM | POA: Diagnosis not present

## 2024-03-16 DIAGNOSIS — E785 Hyperlipidemia, unspecified: Secondary | ICD-10-CM | POA: Diagnosis not present

## 2024-03-16 DIAGNOSIS — Z122 Encounter for screening for malignant neoplasm of respiratory organs: Secondary | ICD-10-CM

## 2024-03-16 DIAGNOSIS — F17210 Nicotine dependence, cigarettes, uncomplicated: Secondary | ICD-10-CM

## 2024-03-16 DIAGNOSIS — Z8673 Personal history of transient ischemic attack (TIA), and cerebral infarction without residual deficits: Secondary | ICD-10-CM | POA: Diagnosis not present

## 2024-03-16 DIAGNOSIS — E66812 Obesity, class 2: Secondary | ICD-10-CM

## 2024-03-16 DIAGNOSIS — Z87891 Personal history of nicotine dependence: Secondary | ICD-10-CM

## 2024-03-16 MED ORDER — HYDROCHLOROTHIAZIDE 12.5 MG PO TABS: 12.5000 mg | ORAL_TABLET | Freq: Every day | ORAL | 2 refills | Status: AC

## 2024-03-16 MED ORDER — TIRZEPATIDE-WEIGHT MANAGEMENT 2.5 MG/0.5ML ~~LOC~~ SOLN: 2.5000 mg | SUBCUTANEOUS | 0 refills | Status: AC

## 2024-03-16 MED ORDER — ASPIRIN 81 MG PO TBEC: 81.0000 mg | DELAYED_RELEASE_TABLET | Freq: Every day | ORAL | 1 refills | Status: AC

## 2024-03-16 NOTE — Assessment & Plan Note (Signed)
 BP has significantly improved. BP today is 124/80. Continue taking Losartan  100 mg and Hydrochlorothiazide  12.5 mg daily. Refill of Hydrochlorothiazide  sent in.

## 2024-03-16 NOTE — Progress Notes (Signed)
 BP 124/80   Pulse 85   Temp 98.2 F (36.8 C)   Ht 4' 11 (1.499 m)   Wt 179 lb (81.2 kg)   LMP  (LMP Unknown)   SpO2 95%   BMI 36.15 kg/m    Subjective:    Patient ID: Jenna Flowers, female    DOB: [birth year], 53 y.o.   MRN: [id number]  HPI: Jenna Flowers is a 53 y.o. female presenting today for a 4 week follow-up to assess blood pressure control. At her appointment 4 weeks ago blood pressure was uncontrolled at 148/100 while taking losartan 100 mg daily. Hydrochlorothiazide 12.5 mg daily was added on for more control. Patient has been taking medication daily with no side effects. She has been taking medication in the morning to prevent nocturia. She denies checking her bp at home. BP today is 124/80. She reports feeling less short of breath now that her blood pressure is more controlled since taking the hydrochlorothiazide.   For hyperlipidemia she is taking rosuvastatin  5 mg daily and tolerating medication well.   Weight Management: She endorses walking 3-4 days a week and working on diet changes such as reducing sugar and carbohydrates and doing intermittent fasting, however she is having a hard time losing weight. She is interested in starting weight loss medications to help manage weight, so she can live an overall healthier life. Her weight today is 179 lb with a BMI of 36.15.    Waist Measurement : 45 inches  Wt Readings from Last 3 Encounters:  03/16/24 179 lb (81.2 kg)  02/17/24 184 lb (83.5 kg)  02/03/24 177 lb 4 oz (80.4 kg)   Body mass index is 36.15 kg/m.       02/17/2024    3:13 PM  Depression screen PHQ 2/9  Decreased Interest 0  Down, Depressed, Hopeless 0  PHQ - 2 Score 0  Altered sleeping 0  Tired, decreased energy 2  Change in appetite 0  Feeling bad or failure about yourself  0  Trouble concentrating 0  Moving slowly or fidgety/restless 0  Suicidal thoughts 0  PHQ-9 Score 2   Difficult doing work/chores Not difficult at all     Data  saved with a previous flowsheet row definition    Relevant past medical, surgical, family and social history reviewed and updated as indicated. Interim medical history since our last visit reviewed. Allergies and medications reviewed and updated.  Review of Systems  Ten systems reviewed and is negative except as mentioned in HPI      Objective:     BP 124/80   Pulse 85   Temp 98.2 F (36.8 C)   Ht 4' 11 (1.499 m)   Wt 179 lb (81.2 kg)   LMP  (LMP Unknown)   SpO2 95%   BMI 36.15 kg/m    Wt Readings from Last 3 Encounters:  03/16/24 179 lb (81.2 kg)  02/17/24 184 lb (83.5 kg)  02/03/24 177 lb 4 oz (80.4 kg)    Physical Exam Constitutional:      Appearance: Normal appearance.  HENT:     Head: Normocephalic and atraumatic.  Cardiovascular:     Rate and Rhythm: Normal rate and regular rhythm.     Pulses: Normal pulses.     Heart sounds: Normal heart sounds.  Pulmonary:     Effort: Pulmonary effort is normal.     Breath sounds: Normal breath sounds.  Musculoskeletal:        General: Normal range  of motion.     Cervical back: Normal range of motion and neck supple.  Skin:    General: Skin is warm and dry.  Neurological:     General: No focal deficit present.     Mental Status: She is alert and oriented to person, place, and time.  Psychiatric:        Mood and Affect: Mood normal.        Behavior: Behavior normal.        Thought Content: Thought content normal.      Results for orders placed or performed in visit on 02/17/24  HM MAMMOGRAPHY   Collection Time: 12/03/23 12:00 AM  Result Value Ref Range   HM Mammogram Self Reported Normal 0-4 Bi-Rad, Self Reported Normal          Assessment & Plan:   Problem List Items Addressed This Visit       Cardiovascular and Mediastinum   Hypertension - Primary   BP has significantly improved. BP today is 124/80. Continue taking Losartan 100 mg and Hydrochlorothiazide 12.5 mg daily. Refill of Hydrochlorothiazide  sent in.       Relevant Medications   aspirin  EC 81 MG tablet   hydrochlorothiazide (HYDRODIURIL) 12.5 MG tablet     Other   Obesity   Weight today is 179 lb with a BMI of 36.15 and waist circumference of 45 inches. Starting Zepbound 2.5 mg dose weekly. Discussed benefits and side effects of medication with patient. We will monitor weight progress every 3 months. Continue with diet and exercise.   - Encourage continuation of lifestyle modifications, including dietary management and regular exercise. -continue to increase physical activity, getting at least 150 min of physical activity a week.  Work on including runner, broadcasting/film/video 2 days a week.  - continue eating at a calorie deficit 1600-1700 cal a day, eating a well balanced diet with whole foods, avoiding processed foods.   Patient is motivated to continue working on lifestyle modification.        Relevant Medications   tirzepatide (ZEPBOUND) 2.5 MG/0.5ML injection vial   HLD (hyperlipidemia)   Continue taking rosuvastatin  5 mg daily. Continue to work on reducing saturated fats in diet.       Relevant Medications   aspirin  EC 81 MG tablet   hydrochlorothiazide (HYDRODIURIL) 12.5 MG tablet   History of CVA (cerebrovascular accident)   Requesting a 90 day supply refill of Aspirin  81 mg. Take medication daily.       Relevant Medications   aspirin  EC 81 MG tablet     Assessment and Plan  Refill of hydrochlorothiazide 12.5 mg sent to pharmacy along with Aspirin  81 mg (90 day supply). Continue taking medications as prescribed. Begin taking subcutaneous injection, Zepbound 2.5 mg once weekly to help with weight management. Discussed the importance of continuing to work on diet and exercise. Educated on the importance of protein and reducing sugar and carbohydrates in the diet. Try to get at least 150 min of moderate activity weekly. Informed patient that we will see her every 3 months to track weight loss progress.       Follow up  plan: Return in about 3 months (around 06/16/2024) for follow up.

## 2024-03-16 NOTE — Assessment & Plan Note (Signed)
 Continue taking rosuvastatin  5 mg daily. Continue to work on reducing saturated fats in diet.

## 2024-03-16 NOTE — Assessment & Plan Note (Signed)
 Requesting a 90 day supply refill of Aspirin  81 mg. Take medication daily.

## 2024-03-16 NOTE — Telephone Encounter (Signed)
 Lung Cancer Screening Narrative/Criteria Questionnaire (Cigarette Smokers Only- No Cigars/Pipes/vapes)   Jenna Flowers   SDMV:04/04/24 8:00- [name]                                           1971-02-09              LDCT: 04/06/24 8:30- DRI A    53 y.o.   Phone: [phone]  Lung Screening Narrative (confirm age 59-77 yrs Medicare / 50-80 yrs Private pay insurance)   Insurance information:[employer]   Referring Provider:Pender   This screening involves an initial phone call with a team member from our program. It is called a shared decision making visit. The initial meeting is required by insurance and Medicare to make sure you understand the program. This appointment takes about 15-20 minutes to complete. The CT scan will completed at a separate date/time. This scan takes about 5-10 minutes to complete and you may eat and drink before and after the scan.  Criteria questions for Lung Cancer Screening:   Are you a current or former smoker? Current Age began smoking: 19   If you are a former smoker, what year did you quit smoking? (within 15 yrs)   To calculate your smoking history, I need an accurate estimate of how many packs of cigarettes you smoked per day and for how many years. (Not just the number of PPD you are now smoking)   Years smoking 34 x Packs per day 1 = Pack years 34   (at least 20 pack yrs)   (Make sure they understand that we need to know how much they have smoked in the past, not just the number of PPD they are smoking now)  Do you have a personal history of cancer?  No    Do you have a family history of cancer? Yes  (cancer type and and relative) Aunt (lung)  Are you coughing up blood?  No  Have you had unexplained weight loss of 15 lbs or more in the last 6 months? No  It looks like you meet all criteria.     Additional information: N/A

## 2024-03-16 NOTE — Assessment & Plan Note (Addendum)
 Weight today is 179 lb with a BMI of 36.15 and waist circumference of 45 inches. Starting Zepbound  2.5 mg dose weekly. Discussed benefits and side effects of medication with patient. We will monitor weight progress every 3 months. Continue with diet and exercise.   - Encourage continuation of lifestyle modifications, including dietary management and regular exercise. -continue to increase physical activity, getting at least 150 min of physical activity a week.  Work on including runner, broadcasting/film/video 2 days a week.  - continue eating at a calorie deficit 1600-1700 cal a day, eating a well balanced diet with whole foods, avoiding processed foods.   Patient is motivated to continue working on lifestyle modification.

## 2024-03-17 ENCOUNTER — Other Ambulatory Visit (HOSPITAL_COMMUNITY): Payer: Self-pay

## 2024-03-17 ENCOUNTER — Telehealth: Payer: Self-pay | Admitting: Pharmacy Technician

## 2024-03-17 NOTE — Telephone Encounter (Signed)
 Prior Authorization form/request asks a question that requires your assistance. Please see the question below and advise accordingly. The PA will not be submitted until the necessary information is received. I'm sorry but I could not find any notes about her trying other meds for weight loss first.

## 2024-03-17 NOTE — Telephone Encounter (Signed)
 Pharmacy Patient Advocate Encounter   Received notification from Onbase that prior authorization for Zepbound 2.5MG /0.5ML pen-injectors  is required/requested.   Insurance verification completed.   The patient is insured through [facility].   Per test claim: PA required; PA started via CoverMyMeds. KEY [id number] . Waiting for clinical questions to populate.

## 2024-03-18 NOTE — Telephone Encounter (Signed)
 Duplicate request, [facility] 03/16/24.  Requested Prescriptions  Pending Prescriptions Disp Refills   hydrochlorothiazide  (HYDRODIURIL ) 12.5 MG tablet [Pharmacy Med Name: HYDROCHLOROTHIAZIDE  12.5MG  TABLETS] 90 tablet     Sig: TAKE 1 TABLET(12.5 MG) BY MOUTH DAILY     Cardiovascular: Diuretics - Thiazide Passed - 03/18/2024  1:27 PM      Passed - Cr in normal range and within 180 days    Creatinine  Date Value Ref Range Status  11/02/2012 0.81 0.60 - 1.30 mg/dL Final   Creatinine, Ser  Date Value Ref Range Status  02/03/2024 0.50 0.44 - 1.00 mg/dL Final         Passed - K in normal range and within 180 days    Potassium  Date Value Ref Range Status  02/03/2024 3.6 3.5 - 5.1 mmol/L Final  11/02/2012 4.1 3.5 - 5.1 mmol/L Final         Passed - Na in normal range and within 180 days    Sodium  Date Value Ref Range Status  02/03/2024 139 135 - 145 mmol/L Final  11/02/2012 142 136 - 145 mmol/L Final         Passed - Last BP in normal range    BP Readings from Last 1 Encounters:  03/16/24 124/80         Passed - Valid encounter within last 6 months    Recent Outpatient Visits           2 days ago Primary hypertension   [facility] Gareth Mliss [place], FNP   1 month ago Immunization due   [facility] Gareth Mliss [place], [place]

## 2024-03-21 ENCOUNTER — Other Ambulatory Visit (HOSPITAL_COMMUNITY): Payer: Self-pay

## 2024-04-04 ENCOUNTER — Encounter

## 2024-04-06 ENCOUNTER — Ambulatory Visit

## 2024-06-16 ENCOUNTER — Ambulatory Visit: Admitting: Nurse Practitioner
